# Patient Record
Sex: Female | Born: 1965 | Hispanic: Yes | Smoking: Never smoker
Health system: Southern US, Community
[De-identification: ages and names within clinical notes are randomized; demographics above are authoritative.]

## PROBLEM LIST (undated history)

## (undated) DIAGNOSIS — T7840XA Allergy, unspecified, initial encounter: Secondary | ICD-10-CM

## (undated) DIAGNOSIS — J45909 Unspecified asthma, uncomplicated: Secondary | ICD-10-CM

## (undated) DIAGNOSIS — F419 Anxiety disorder, unspecified: Secondary | ICD-10-CM

## (undated) HISTORY — DX: Unspecified asthma, uncomplicated: J45.909

## (undated) HISTORY — DX: Allergy, unspecified, initial encounter: T78.40XA

## (undated) HISTORY — DX: Anxiety disorder, unspecified: F41.9

---

## 2012-03-15 LAB — HM PAP SMEAR: HM Pap smear: NORMAL

## 2012-03-15 LAB — HM MAMMOGRAPHY: HM Mammogram: NORMAL

## 2015-04-29 ENCOUNTER — Telehealth: Payer: Self-pay | Admitting: Behavioral Health

## 2015-04-29 ENCOUNTER — Encounter: Payer: Self-pay | Admitting: Behavioral Health

## 2015-04-29 NOTE — Telephone Encounter (Signed)
Pre-Visit Call completed with patient and chart updated.   Pre-Visit Info documented in Specialty Comments under [employer].    

## 2015-04-30 ENCOUNTER — Encounter: Payer: Self-pay | Admitting: Medical

## 2015-04-30 ENCOUNTER — Ambulatory Visit (INDEPENDENT_AMBULATORY_CARE_PROVIDER_SITE_OTHER): Payer: BC Managed Care – PPO | Admitting: Medical

## 2015-04-30 VITALS — BP 106/78 | HR 78 | Temp 98.1°F | Ht 65.5 in | Wt 168.8 lb

## 2015-04-30 DIAGNOSIS — F4323 Adjustment disorder with mixed anxiety and depressed mood: Secondary | ICD-10-CM

## 2015-04-30 DIAGNOSIS — M2550 Pain in unspecified joint: Secondary | ICD-10-CM

## 2015-04-30 DIAGNOSIS — R635 Abnormal weight gain: Secondary | ICD-10-CM | POA: Diagnosis not present

## 2015-04-30 DIAGNOSIS — J309 Allergic rhinitis, unspecified: Secondary | ICD-10-CM

## 2015-04-30 DIAGNOSIS — R5383 Other fatigue: Secondary | ICD-10-CM

## 2015-04-30 LAB — COMPREHENSIVE METABOLIC PANEL
ALT: 15 U/L (ref 0–35)
AST: 18 U/L (ref 0–37)
Albumin: 4.1 g/dL (ref 3.5–5.2)
Alkaline Phosphatase: 51 U/L (ref 39–117)
BUN: 14 mg/dL (ref 6–23)
CALCIUM: 9.3 mg/dL (ref 8.4–10.5)
CHLORIDE: 104 meq/L (ref 96–112)
CO2: 31 meq/L (ref 19–32)
Creatinine, Ser: 0.75 mg/dL (ref 0.40–1.20)
GFR: 87.55 mL/min (ref >60.00)
Glucose, Bld: 94 mg/dL (ref 70–99)
POTASSIUM: 4 meq/L (ref 3.5–5.1)
Sodium: 139 mEq/L (ref 135–145)
Total Bilirubin: 0.5 mg/dL (ref 0.2–1.2)
Total Protein: 7.2 g/dL (ref 6.0–8.3)

## 2015-04-30 LAB — CBC WITH DIFFERENTIAL/PLATELET
BASOS PCT: 0.4 % (ref 0.0–3.0)
Basophils Absolute: 0 10*3/uL (ref 0.0–0.1)
EOS ABS: 0.2 10*3/uL (ref 0.0–0.7)
EOS PCT: 2.9 % (ref 0.0–5.0)
HEMATOCRIT: 42.5 % (ref 36.0–46.0)
Hemoglobin: 14.1 g/dL (ref 12.0–15.0)
LYMPHS PCT: 24.3 % (ref 12.0–46.0)
Lymphs Abs: 1.7 10*3/uL (ref 0.7–4.0)
MCHC: 33.3 g/dL (ref 30.0–36.0)
MCV: 93.7 fl (ref 78.0–100.0)
Monocytes Absolute: 0.4 10*3/uL (ref 0.1–1.0)
Monocytes Relative: 5.1 % (ref 3.0–12.0)
NEUTROS ABS: 4.7 10*3/uL (ref 1.4–7.7)
Neutrophils Relative %: 67.3 % (ref 43.0–77.0)
PLATELETS: 278 10*3/uL (ref 150.0–400.0)
RBC: 4.53 Mil/uL (ref 3.87–5.11)
RDW: 13 % (ref 11.5–15.5)
WBC: 7 10*3/uL (ref 4.0–10.5)

## 2015-04-30 LAB — VITAMIN B12: VITAMIN B 12: 321 pg/mL (ref 211–911)

## 2015-04-30 LAB — RHEUMATOID FACTOR

## 2015-04-30 LAB — TSH: TSH: 0.67 u[IU]/mL (ref 0.35–4.50)

## 2015-04-30 LAB — SEDIMENTATION RATE: SED RATE: 16 mm/h (ref 0–22)

## 2015-04-30 LAB — C-REACTIVE PROTEIN: CRP: 0.2 mg/dL — AB (ref 0.5–20.0)

## 2015-04-30 MED ORDER — AZELASTINE HCL 0.1 % NA SOLN: 2.0000 | Freq: Two times a day (BID) | NASAL | Status: DC

## 2015-04-30 NOTE — Patient Instructions (Addendum)
For you allergies. Continue zytec and will  add astelin spray.  For fatigue and weight gain will get cbc,cmp  And tsh.  For joint and muscle pains will get inflammatory studies.  Will do studies first to see if cause of depressed mood, anxious, fatigue, weight gain and body aches can be found. If our mood worsens or changes please notify us.  Follow up 2-3 weeks or as needed

## 2015-04-30 NOTE — Assessment & Plan Note (Signed)
Continue zyrtec. Will add astelin spray for her chronic year round allergies.

## 2015-04-30 NOTE — Progress Notes (Signed)
Subjective:    Patient ID: Brooke Mcmahon, female    DOB: 1966/01/14, 50 y.o.   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  Allergies- all year round. Pt takes Zyrtec. No sprays. Pt willing to try sprays but various meds in past did not help much. Zyrtec helps a little.  Asthma- pt state hx of. Pt states last time had to use inhaler was 4-5 months ago. She uses advair. But she does not use it daily.  Anxiety/depression- Pt states this is going on for 2 weeks.  Pt states she has been crying occasional since Sunday. Pt can't figure out what could be causing this. She states everything good in her life. Pt states she is falls asleep fine. Appetite ok. Pt has gained some weight about 20 lbs in past 6-7 months. LMP- last Friday. Was normal cycle. Pt still having enjoyment with life activities. She has friends. No work stress.  Pt states her son has some bipolar history. But other family with psychiatric diagnosis. Pt states no history of depression or anxiety for herself.   Pt states with this she has some pain in her feet along with this . Pt skin feels some sensitive in various areas. Pt denies any elow or knee pain. Pt states bottom of her feet hurt most. Pt states she walked 4-5 miles last weekend. Traasient pain on and off left rib, both hamsring areas and buttox. These area come and go. Also some anterior tibial area pain.  Last papsmear- was normal. Mammogram- normal in the past.    Review of Systems  Constitutional: Positive for fatigue. Negative for fever and chills.  HENT: Negative for dental problem.   Respiratory: Negative for cough, shortness of breath and wheezing.   Cardiovascular: Negative for chest pain and palpitations.  Musculoskeletal: Positive for myalgias  and arthralgias.       See hpi.   Skin: Negative for rash.  Hematological: Negative for adenopathy. Does not bruise/bleed easily.  Psychiatric/Behavioral: Positive for dysphoric mood. Negative for suicidal ideas, confusion, sleep disturbance, self-injury, decreased concentration and agitation. The patient is nervous/anxious.    Past Medical History  Diagnosis Date  . Allergy   . Asthma   . Anxiety     Social History   Social History  . Marital Status: Single    Spouse Name: N/A  . Number of Children: N/A  . Years of Education: N/A   Occupational History  . Not on file.   Social History Main Topics  . Smoking status: Never Smoker   . Smokeless tobacco: Never Used  . Alcohol Use: 0.0 oz/week    0 Standard drinks or equivalent per week     Comment: occasionally. 2 beers a month at most.  . Drug Use: No  . Sexual Activity: Not on file   Other Topics Concern  . Not on file   Social History Narrative    Past Surgical History  Procedure Laterality Date  . Cesarean section      Family History  Problem Relation Age of Onset  . Hypertension Mother   . Hypertension Father   . Heart disease Sister     No Known Allergies  Current Outpatient Prescriptions on File Prior to Visit  Medication Sig Dispense Refill  .  Multiple Vitamins-Minerals (MULTIVITAMIN WITH MINERALS) tablet Take 1 tablet by mouth daily.     No current facility-administered medications on file prior to visit.    BP 106/78 mmHg  Pulse 78  Temp(Src) 98.1 F (36.7 C) (Oral)  Ht 5' 5.5" (1.664 m)  Wt 168 lb 12.8 oz (76.567 kg)  BMI 27.65 kg/m2  SpO2 98%  LMP 04/25/2015       Objective:   Physical Exam  General- No acute distress. Pleasant patient. Heent- boggy turbinates, +pnd. Neck- Full range of motion, no jvd. (no thyromegaly) Lungs- Clear, even and unlabored. Heart- regular rate and rhythm. Neurologic- CNII- XII grossly intact. Back- no cva tenderness  Rt ankle- no pain on flexion  and extension.No pain on palpation.  Skin- no rash reported(discussed with pt and she assured me.) No vesicles seen.        Assessment & Plan:  For you allergies. Continue zytec and will  add astelin spray.  For fatigue and weight gain will get cbc,cmp  And tsh.  For joint and muscle pains will get inflammatory studies.  Will do studies first to see if cause of depressed mood, anxious, fatigue, weight gain and body aches can be found. If our mood worsens or changes please notify us.  Follow up 2-3 weeks or as needed

## 2015-04-30 NOTE — Progress Notes (Signed)
Pre visit review using our clinic review tool, if applicable. No additional management support is needed unless otherwise documented below in the visit note. 

## 2015-05-01 ENCOUNTER — Telehealth: Payer: Self-pay | Admitting: Medical

## 2015-05-01 DIAGNOSIS — R768 Other specified abnormal immunological findings in serum: Secondary | ICD-10-CM

## 2015-05-01 LAB — ANTI-NUCLEAR AB-TITER (ANA TITER)

## 2015-05-01 LAB — ANA: Anti Nuclear Antibody(ANA): POSITIVE — AB

## 2015-05-01 NOTE — Telephone Encounter (Signed)
Rheumatology referral placed

## 2015-05-21 ENCOUNTER — Telehealth: Payer: Self-pay | Admitting: Medical

## 2015-05-21 ENCOUNTER — Encounter: Payer: BC Managed Care – PPO | Admitting: Medical

## 2015-05-21 NOTE — Telephone Encounter (Signed)
Pt called in at 8:12 she says that she couldn't find her car keys so she couldn't come in. Offered pt a later time. She says that will call back to reschedule.

## 2015-05-21 NOTE — Progress Notes (Signed)
This encounter was created in error - please disregard.

## 2015-05-22 NOTE — Telephone Encounter (Signed)
No charge. 

## 2015-05-22 NOTE — Telephone Encounter (Signed)
Charge or no charge? °

## 2015-09-08 ENCOUNTER — Ambulatory Visit (INDEPENDENT_AMBULATORY_CARE_PROVIDER_SITE_OTHER): Payer: BC Managed Care – PPO | Admitting: Family Medicine

## 2015-09-08 ENCOUNTER — Ambulatory Visit (HOSPITAL_BASED_OUTPATIENT_CLINIC_OR_DEPARTMENT_OTHER)
Admission: RE | Admit: 2015-09-08 | Discharge: 2015-09-08 | Disposition: A | Discharge status: Home / Self Care | Payer: BC Managed Care – PPO | Source: Ambulatory Visit | Attending: Family Medicine | Admitting: Family Medicine

## 2015-09-08 ENCOUNTER — Encounter: Payer: Self-pay | Admitting: Family Medicine

## 2015-09-08 VITALS — BP 116/74 | HR 66 | Temp 98.2°F | Ht 64.0 in | Wt 168.8 lb

## 2015-09-08 DIAGNOSIS — S46911A Strain of unspecified muscle, fascia and tendon at shoulder and upper arm level, right arm, initial encounter: Secondary | ICD-10-CM

## 2015-09-08 DIAGNOSIS — S161XXA Strain of muscle, fascia and tendon at neck level, initial encounter: Secondary | ICD-10-CM

## 2015-09-08 DIAGNOSIS — X58XXXA Exposure to other specified factors, initial encounter: Secondary | ICD-10-CM | POA: Diagnosis not present

## 2015-09-08 MED ORDER — PREDNISONE 20 MG PO TABS: ORAL_TABLET | ORAL | Status: DC

## 2015-09-08 MED ORDER — METHOCARBAMOL 500 MG PO TABS: 500.0000 mg | ORAL_TABLET | Freq: Three times a day (TID) | ORAL | Status: DC | PRN

## 2015-09-08 NOTE — Progress Notes (Signed)
[facility] [place] [address], [address] [place], [place] [address] [phone] [phone]  Date:  09/08/2015   Name:  Brooke Mcmahon   DOB:  1965/05/06   MRN:  [id number]  PCP:  Esperanza RichtersSaguier, Edward, PA-C    Chief Complaint: Right Shoulder Pain   History of Present Illness:  Brooke Mcmahon is a 50 y.o. very pleasant female patient who presents with the following:  She has noted an issue with her right arm and neck -pain seems most concentrated in the deltoid muscle.  She is not aware of any injury, any pull on her neck that might cause a brachial plexus injury or other cause of the pain She has noted this for a couple of days.   There has been no accident or pull of the arm/ shoulder.  She does sleep with her arm over her head some of the time- first noted the sx in the morning so it may be related to her sleep position   She had a similar issue about 5 years ago but it self- resolved. Never had any evalaution for this    She is generally in good health LMP 6/5 She has tried some aleve for this.  She has not applied anythning topically or any rx issues  Never had any neck issues or shoulder issues in the past OW feels well today  Patient Active Problem List   Diagnosis Date Noted  . Allergic rhinitis 04/30/2015    Past Medical History  Diagnosis Date  . Allergy   . Asthma   . Anxiety     Past Surgical History  Procedure Laterality Date  . Cesarean section      Social History  Substance Use Topics  . Smoking status: Never Smoker   . Smokeless tobacco: Never Used  . Alcohol Use: 0.0 oz/week    0 Standard drinks or equivalent per week     Comment: occasionally. 2 beers a month at most.    Family History  Problem Relation Age of Onset  . Hypertension Mother   . Hypertension Father   . Heart disease Sister     No Known Allergies  Medication list has been reviewed and updated.  Current Outpatient Prescriptions on File  Prior to Visit  Medication Sig Dispense Refill  . Multiple Vitamins-Minerals (MULTIVITAMIN WITH MINERALS) tablet Take 1 tablet by mouth daily.     No current facility-administered medications on file prior to visit.    Review of Systems:  As per HPI- otherwise negative.   Physical Examination: Filed Vitals:   09/08/15 1142  BP: 116/74  Pulse: 66  Temp: 98.2 F (36.8 C)   Filed Vitals:   09/08/15 1142  Height: 5\' 4"  (1.626 m)  Weight: 168 lb 12.8 oz (76.567 kg)   Body mass index is 28.96 kg/(m^2). Ideal Body Weight: Weight in (lb) to have BMI = 25: 145.3  GEN: WDWN, NAD, Non-toxic, A & O x 3, mild overweight, looks well and healthy HEENT: Atraumatic, Normocephalic. Neck supple. No masses, No LAD.  Bilateral TM wnl, oropharynx normal.  PEERL,EOMI.   Ears and Nose: No external deformity. CV: RRR, No M/G/R. No JVD. No thrill. No extra heart sounds. PULM: CTA B, no wheezes, crackles, rhonchi. No retractions. No resp. distress. No accessory muscle use. ABD: S, NT, ND, +BS. No rebound. No HSM. EXTR: No c/c/e NEURO Normal gait.  PSYCH: Normally interactive. Conversant. Not depressed or anxious appearing.  Calm demeanor.  She notes tenderness over the lateral deltoid muscle, no heat, redness or swelling. She has normal ROM of the right shoulder and normal strength.  Not tender over RCT insertion.   Cervical spine is non- tender and shows full ROM  No numbness of either arm on exam  Dg Cervical Spine Complete  09/08/2015  CLINICAL DATA:  Neck strain EXAM: CERVICAL SPINE - COMPLETE 4+ VIEW COMPARISON:  None. FINDINGS: There is no evidence of cervical spine fracture or prevertebral soft tissue swelling. Alignment is normal. No other significant bone abnormalities are identified. IMPRESSION: Negative cervical spine radiographs. Electronically Signed   By: Marlan Palauharles  Clark M.D.   On: 09/08/2015 13:12    Assessment and Plan: Right shoulder strain, initial encounter - Plan: methocarbamol  (ROBAXIN) 500 MG tablet, predniSONE (DELTASONE) 20 MG tablet  Neck strain, initial encounter - Plan: DG Cervical Spine Complete  Jere today with pain in her neck and right shoulder  Her sx seem consistent with a brachial plexus strain but there is no injury Will treat with prednisone and a muscle relaxer as needed Neck films normal She will let me know if not improving soon  Signed Abbe AmsterdamJessica Copland, MD

## 2015-09-08 NOTE — Progress Notes (Signed)
Pre visit review using our clinic review tool, if applicable. No additional management support is needed unless otherwise documented below in the visit note. 

## 2015-09-08 NOTE — Patient Instructions (Signed)
Please go to the ground floor for x-rays, then you may go home I will be in touch with your x-ray results asap  Use the robaxin as needed for muscle pain- however remember that it may cause you to feel sleepy Use the prednisone as directed for inflammation.  While you are taking this do not take NSAID medications like ibuprofen or aleve (tyelnol is ok)  Let me know if your neck and shoulder are not feeling much better in the next couple of days- Sooner if worse.

## 2015-09-17 ENCOUNTER — Telehealth: Payer: Self-pay | Admitting: Medical

## 2015-09-17 DIAGNOSIS — M542 Cervicalgia: Secondary | ICD-10-CM

## 2015-09-17 DIAGNOSIS — R200 Anesthesia of skin: Secondary | ICD-10-CM

## 2015-09-17 DIAGNOSIS — R202 Paresthesia of skin: Principal | ICD-10-CM

## 2015-09-17 NOTE — Telephone Encounter (Signed)
Relation to ZO:XWRUpt:self Call back number:[phone] Pharmacy:  Reason for call:  Patient was last seen 09/08/2015 states the medication prescribe is not working, in need of clinical advice. Advised patient Dr. Patsy Lageropland is out of the office. Please advise

## 2015-09-18 NOTE — Telephone Encounter (Signed)
Called and spoke to pt who was seen on 09/08/15 for right arm and shoulder pain. Pt states that the pain is still present and does not feel the rx for Robaxin and Prednisone are helping.

## 2015-09-18 NOTE — Telephone Encounter (Signed)
Called her back - her sx are not changed, she continues to have numbness and tingling in her right arm and her neck is painful. At this time I will arrange an MRI for her. She is ok with this plan and confirms that she does not have any implanted devices

## 2015-09-19 ENCOUNTER — Telehealth: Payer: Self-pay | Admitting: Medical

## 2015-09-19 DIAGNOSIS — M542 Cervicalgia: Secondary | ICD-10-CM

## 2015-09-19 NOTE — Telephone Encounter (Signed)
Would you check and see if I ordered mri or if Dr. Patsy Lageropland did? If patient states too costly we could refer to sports medicine.

## 2015-09-19 NOTE — Telephone Encounter (Signed)
Please advise 

## 2015-09-19 NOTE — Telephone Encounter (Signed)
Patient called stating that her insurance will not cover the cost of an MRI and she will have to pay $800. Wants to know if there is another test she can have done for her symptoms. Patient cancelled MRI

## 2015-09-20 ENCOUNTER — Ambulatory Visit (HOSPITAL_BASED_OUTPATIENT_CLINIC_OR_DEPARTMENT_OTHER): Payer: BC Managed Care – PPO

## 2015-09-22 NOTE — Telephone Encounter (Addendum)
Spoke with pt and she states that she is ok to go ahead with the sports medicine referral. Pt was advised of the appointment on 09/25/15 for sports medicine at 9:30 am. Pt did not have any further questions. .Marland Kitchen

## 2015-09-25 ENCOUNTER — Ambulatory Visit (INDEPENDENT_AMBULATORY_CARE_PROVIDER_SITE_OTHER): Payer: BC Managed Care – PPO | Admitting: Family Medicine

## 2015-09-25 ENCOUNTER — Encounter: Payer: Self-pay | Admitting: Family Medicine

## 2015-09-25 VITALS — BP 111/76 | HR 69 | Ht 64.0 in | Wt 166.6 lb

## 2015-09-25 DIAGNOSIS — M79601 Pain in right arm: Secondary | ICD-10-CM | POA: Diagnosis not present

## 2015-09-25 DIAGNOSIS — M25511 Pain in right shoulder: Secondary | ICD-10-CM | POA: Diagnosis not present

## 2015-09-25 MED ORDER — METHYLPREDNISOLONE ACETATE 40 MG/ML IJ SUSP
40.0000 mg | Freq: Once | INTRAMUSCULAR | Status: AC
Start: 2015-09-25 — End: 2015-09-25
  Administered 2015-09-25: 40 mg via INTRA_ARTICULAR

## 2015-09-25 MED ORDER — HYDROCODONE-ACETAMINOPHEN 5-325 MG PO TABS: 1.0000 | ORAL_TABLET | Freq: Four times a day (QID) | ORAL | Status: DC | PRN

## 2015-09-25 NOTE — Patient Instructions (Addendum)
You have rotator cuff impingement in addition to an irritated nerve from your neck. Try to avoid painful activities (overhead activities, lifting with extended arm) as much as possible. Aleve 2 tabs twice a day with food OR ibuprofen 3 tabs three times a day with food for pain and inflammation. Norco as needed for severe pain (no driving on this). Subacromial injection may be beneficial to help with pain and to decrease inflammation - you were given this today. Consider physical therapy with transition to home exercise program. Do home exercise program with theraband and scapular stabilization exercises daily - these are very important for long term relief even if an injection was given.  Wait a week before starting these and do 3 sets of 10 if tolerated If not improving at follow-up we will consider further imaging, physical therapy, and/or nitro patches. Call me in 1-2 weeks to let me know how you're doing.

## 2015-09-29 DIAGNOSIS — M79601 Pain in right arm: Secondary | ICD-10-CM | POA: Insufficient documentation

## 2015-09-29 NOTE — Progress Notes (Signed)
PCP and consultation requested by: Esperanza RichtersSaguier, Edward, PA-C  Subjective:   HPI: Patient is a 50 y.o. female here for right arm pain.  Patient reports for about 1 month she's had fairly consistent pain right upper arm into lower arm and neck.   Burning sensation from right side of neck to the elbow. Gets cold sensation from elbow to fingers, right ring finger goes numb. Right handed. Had similar problem 5-6 years ago that improved with nsaids and muscle relaxant. Current pain 10/10, sharp, worse with motions of shoulder. No new injury or trauma.  Past Medical History  Diagnosis Date  . Allergy   . Asthma   . Anxiety     Current Outpatient Prescriptions on File Prior to Visit  Medication Sig Dispense Refill  . albuterol (PROVENTIL HFA;VENTOLIN HFA) 108 (90 Base) MCG/ACT inhaler Inhale 2 puffs into the lungs as needed.    . Fluticasone-Salmeterol (ADVAIR DISKUS) 250-50 MCG/DOSE AEPB Inhale 1 puff into the lungs as needed.    . methocarbamol (ROBAXIN) 500 MG tablet Take 1 tablet (500 mg total) by mouth every 8 (eight) hours as needed for muscle spasms. 30 tablet 0  . Multiple Vitamins-Minerals (MULTIVITAMIN WITH MINERALS) tablet Take 1 tablet by mouth daily.     No current facility-administered medications on file prior to visit.    Past Surgical History  Procedure Laterality Date  . Cesarean section      No Known Allergies  Social History   Social History  . Marital Status: Single    Spouse Name: N/A  . Number of Children: N/A  . Years of Education: N/A   Occupational History  . Not on file.   Social History Main Topics  . Smoking status: Never Smoker   . Smokeless tobacco: Never Used  . Alcohol Use: 0.0 oz/week    0 Standard drinks or equivalent per week     Comment: occasionally. 2 beers a month at most.  . Drug Use: No  . Sexual Activity: Not on file   Other Topics Concern  . Not on file   Social History Narrative    Family History  Problem Relation Age  of Onset  . Hypertension Mother   . Hypertension Father   . Heart disease Sister     BP 111/76 mmHg  Pulse 69  Ht 5\' 4"  (1.626 m)  Wt 166 lb 9.6 oz (75.569 kg)  BMI 28.58 kg/m2  LMP 08/18/2015  Review of Systems: See HPI above.    Objective:  Physical Exam:  Gen: NAD, comfortable in exam room  Neck: No gross deformity, swelling, bruising. TTP mildly right cervical paraspinal region.  No midline/bony TTP. FROM neck without pain. BUE strength 5/5 except rotator cuff testing below.   Sensation intact to light touch.   2+ equal reflexes in triceps, biceps, brachioradialis tendons. Negative spurlings. NV intact distal BUEs.  Right shoulder: No swelling, ecchymoses.  No gross deformity. No TTP. FROM with painful arc. Positive Hawkins, Neers. Negative Yergasons. Strength 5/5 with empty can and resisted internal/external rotation.  Painful empty can > ER Negative apprehension. NV intact distally.    Assessment & Plan:  1. Right arm pain - patient's history suggests radiculopathy but exam more of rotator cuff impingement.  Given subacromial injection.  NSAIDs as needed, norco as needed severe pain.  Shown home exercises to do daily.  Consider further imaging, physical therapy, and/or nitro patches.  Call us in 1-2 weeks for an update on her status.

## 2015-09-29 NOTE — Assessment & Plan Note (Signed)
patient's history suggests radiculopathy but exam more of rotator cuff impingement.  Given subacromial injection.  NSAIDs as needed, norco as needed severe pain.  Shown home exercises to do daily.  Consider further imaging, physical therapy, and/or nitro patches.  Call us in 1-2 weeks for an update on her status.

## 2015-11-11 ENCOUNTER — Encounter: Payer: Self-pay | Admitting: Family Medicine

## 2015-11-11 ENCOUNTER — Ambulatory Visit (INDEPENDENT_AMBULATORY_CARE_PROVIDER_SITE_OTHER): Payer: BC Managed Care – PPO | Admitting: Family Medicine

## 2015-11-11 DIAGNOSIS — M79601 Pain in right arm: Secondary | ICD-10-CM

## 2015-11-11 MED ORDER — DICLOFENAC SODIUM 75 MG PO TBEC: 75.0000 mg | DELAYED_RELEASE_TABLET | Freq: Two times a day (BID) | ORAL | 1 refills | Status: DC

## 2015-11-11 MED ORDER — NITROGLYCERIN 0.2 MG/HR TD PT24: MEDICATED_PATCH | TRANSDERMAL | 1 refills | Status: DC

## 2015-11-11 NOTE — Patient Instructions (Addendum)
You have rotator cuff impingement and bursitis. Try to avoid painful activities (overhead activities, lifting with extended arm) as much as possible. Diclofenac 75mg  twice a day with food for pain and inflammation. I would not repeat the injection at this time. Nitro patches 1/4th patch over affected area, change daily. Consider physical therapy with transition to home exercise program. Do home exercise program with theraband and scapular stabilization exercises daily - these are very important for long term relief even if an injection was given.  3 sets of 10 once a day. Follow up with me in 6 weeks. However if you're not improving over the next 2-4 weeks, call me and we would order physical therapy.

## 2015-11-13 NOTE — Progress Notes (Signed)
PCP and consultation requested by: Brooke Mcmahon, Edward, PA-C  Subjective:   HPI: Patient is a 50 y.o. female here for right arm pain.  7/13: Patient reports for about 1 month she's had fairly consistent pain right upper arm into lower arm and neck.   Burning sensation from right side of neck to the elbow. Gets cold sensation from elbow to fingers, right ring finger goes numb. Right handed. Had similar problem 5-6 years ago that improved with nsaids and muscle relaxant. Current pain 10/10, sharp, worse with motions of shoulder. No new injury or trauma.  8/30: Patient returns reporting she feels about the same. Pain is 8/10, sharp lateral right shoulder, upper arm. Reports injection only helped about 1 day. Having difficulty sleeping. Is doing home exercises. Worse lying on this die too. No numbness or tingling now. No skin changes.  Past Medical History:  Diagnosis Date  . Allergy   . Anxiety   . Asthma     Current Outpatient Prescriptions on File Prior to Visit  Medication Sig Dispense Refill  . albuterol (PROVENTIL HFA;VENTOLIN HFA) 108 (90 Base) MCG/ACT inhaler Inhale 2 puffs into the lungs as needed.    . Fluticasone-Salmeterol (ADVAIR DISKUS) 250-50 MCG/DOSE AEPB Inhale 1 puff into the lungs as needed.    . methocarbamol (ROBAXIN) 500 MG tablet Take 1 tablet (500 mg total) by mouth every 8 (eight) hours as needed for muscle spasms. 30 tablet 0  . Multiple Vitamins-Minerals (MULTIVITAMIN WITH MINERALS) tablet Take 1 tablet by mouth daily.     No current facility-administered medications on file prior to visit.     Past Surgical History:  Procedure Laterality Date  . CESAREAN SECTION      No Known Allergies  Social History   Social History  . Marital status: Single    Spouse name: N/A  . Number of children: N/A  . Years of education: N/A   Occupational History  . Not on file.   Social History Main Topics  . Smoking status: Never Smoker  . Smokeless  tobacco: Never Used  . Alcohol use 0.0 oz/week     Comment: occasionally. 2 beers a month at most.  . Drug use: No  . Sexual activity: Not on file   Other Topics Concern  . Not on file   Social History Narrative  . No narrative on file    Family History  Problem Relation Age of Onset  . Hypertension Mother   . Hypertension Father   . Heart disease Sister     BP (!) 103/59   Pulse 73   Ht 5\' 4"  (1.626 m)   Wt 166 lb (75.3 kg)   BMI 28.49 kg/m   Review of Systems: See HPI above.    Objective:  Physical Exam:  Gen: NAD, comfortable in exam room  Neck: No gross deformity, swelling, bruising. No TTP paraspinal regions.  No midline/bony TTP. FROM neck without pain. BUE strength 5/5 except rotator cuff testing below.   Sensation intact to light touch.   2+ equal reflexes in triceps, biceps, brachioradialis tendons. Negative spurlings. NV intact distal BUEs.  Right shoulder: No swelling, ecchymoses.  No gross deformity. No TTP. FROM with painful arc. Positive Hawkins, Neers. Negative Yergasons. Strength 5/5 with empty can and resisted internal/external rotation.  Painful empty can > ER Negative apprehension. NV intact distally.  MSK u/s right shoulder: Biceps tendon intact on long and trans views.  Subscapularis, infraspinatus, supraspinatus also intact on long and trans views.  Subacromial  bursitis noted.  AC joint normal.  Could not reproduce impingement on ultrasound.    Assessment & Plan:  1. Right shoulder pain - 2/2 subacromial bursitis, exam indicates impingement also but unable to reproduce this part on ultrasound.  No improvement with injection.  She will start diclofenac, nitro patches, continue home exercises.  Will consider physical therapy if not improving over next couple weeks.  F/u in 6 weeks.

## 2015-11-13 NOTE — Assessment & Plan Note (Signed)
2/2 subacromial bursitis, exam indicates impingement also but unable to reproduce this part on ultrasound.  No improvement with injection.  She will start diclofenac, nitro patches, continue home exercises.  Will consider physical therapy if not improving over next couple weeks.  F/u in 6 weeks.

## 2015-12-10 ENCOUNTER — Ambulatory Visit: Payer: BC Managed Care – PPO | Admitting: Family Medicine

## 2015-12-16 ENCOUNTER — Encounter: Payer: Self-pay | Admitting: Family Medicine

## 2015-12-16 ENCOUNTER — Ambulatory Visit (INDEPENDENT_AMBULATORY_CARE_PROVIDER_SITE_OTHER): Payer: BC Managed Care – PPO | Admitting: Family Medicine

## 2015-12-16 DIAGNOSIS — M79601 Pain in right arm: Secondary | ICD-10-CM

## 2015-12-16 NOTE — Patient Instructions (Signed)
Your shoulder exam is normal now. You have a forearm strain. I would continue home exercises for 4-6 more weeks. Consider adding wrist extension, hammer rotation exercises for the forearm. Call me if you want to try physical therapy. Use diclofenac only if needed. Nitro patches 1/4th patch over affected area, change daily. Follow up with me as needed.

## 2015-12-17 NOTE — Progress Notes (Signed)
PCP and consultation requested by: Esperanza RichtersSaguier, Edward, PA-C  Subjective:   HPI: Patient is a 50 y.o. female here for right arm pain.  7/13: Patient reports for about 1 month she's had fairly consistent pain right upper arm into lower arm and neck.   Burning sensation from right side of neck to the elbow. Gets cold sensation from elbow to fingers, right ring finger goes numb. Right handed. Had similar problem 5-6 years ago that improved with nsaids and muscle relaxant. Current pain 10/10, sharp, worse with motions of shoulder. No new injury or trauma.  8/30: Patient returns reporting she feels about the same. Pain is 8/10, sharp lateral right shoulder, upper arm. Reports injection only helped about 1 day. Having difficulty sleeping. Is doing home exercises. Worse lying on this die too. No numbness or tingling now. No skin changes.  10/3: Patient reports she has improved. Pain level is 0/10 currently. Difficulty picking up heavy items - can get 7/10 level of pain in upper arm area. Stopped diclofenac and nitro last week. Still doing home exercises.  Past Medical History:  Diagnosis Date  . Allergy   . Anxiety   . Asthma     Current Outpatient Prescriptions on File Prior to Visit  Medication Sig Dispense Refill  . albuterol (PROVENTIL HFA;VENTOLIN HFA) 108 (90 Base) MCG/ACT inhaler Inhale 2 puffs into the lungs as needed.    . diclofenac (VOLTAREN) 75 MG EC tablet Take 1 tablet (75 mg total) by mouth 2 (two) times daily. 60 tablet 1  . Fluticasone-Salmeterol (ADVAIR DISKUS) 250-50 MCG/DOSE AEPB Inhale 1 puff into the lungs as needed.    . methocarbamol (ROBAXIN) 500 MG tablet Take 1 tablet (500 mg total) by mouth every 8 (eight) hours as needed for muscle spasms. 30 tablet 0  . Multiple Vitamins-Minerals (MULTIVITAMIN WITH MINERALS) tablet Take 1 tablet by mouth daily.    . nitroGLYCERIN (NITRODUR - DOSED IN MG/24 HR) 0.2 mg/hr patch Apply 1/4th patch to affected shoulder,  change daily 30 patch 1   No current facility-administered medications on file prior to visit.     Past Surgical History:  Procedure Laterality Date  . CESAREAN SECTION      No Known Allergies  Social History   Social History  . Marital status: Single    Spouse name: N/A  . Number of children: N/A  . Years of education: N/A   Occupational History  . Not on file.   Social History Main Topics  . Smoking status: Never Smoker  . Smokeless tobacco: Never Used  . Alcohol use 0.0 oz/week     Comment: occasionally. 2 beers a month at most.  . Drug use: No  . Sexual activity: Not on file   Other Topics Concern  . Not on file   Social History Narrative  . No narrative on file    Family History  Problem Relation Age of Onset  . Hypertension Mother   . Hypertension Father   . Heart disease Sister     BP 114/77   Pulse 80   Ht 5\' 4"  (1.626 m)   Wt 163 lb (73.9 kg)   BMI 27.98 kg/m   Review of Systems: See HPI above.    Objective:  Physical Exam:  Gen: NAD, comfortable in exam room  Neck: No gross deformity, swelling, bruising. No TTP paraspinal regions.  No midline/bony TTP. FROM neck without pain. BUE strength 5/5.   NV intact distal BUEs.  Right shoulder: No swelling, ecchymoses.  No gross deformity. No TTP shoulder - mild extensor forearm tenderness FROM with negative painful arc. Negative Hawkins, Neers. Negative Yergasons. Strength 5/5 with empty can and resisted internal/external rotation without pain Negative apprehension. NV intact distally.    Assessment & Plan:  1. Right shoulder pain - 2/2 subacromial bursitis, impingement.  Much improved with home exercises, having done diclofenac and nitro patches until a week ago.  Continue home exercises.  Diclofenac only if needed.  F/u prn.  Can continue nitro patches up to 6 weeks if needed.

## 2015-12-18 NOTE — Assessment & Plan Note (Signed)
2/2 subacromial bursitis, impingement.  Much improved with home exercises, having done diclofenac and nitro patches until a week ago.  Continue home exercises.  Diclofenac only if needed.  F/u prn.  Can continue nitro patches up to 6 weeks if needed.

## 2016-01-26 ENCOUNTER — Ambulatory Visit (INDEPENDENT_AMBULATORY_CARE_PROVIDER_SITE_OTHER): Payer: BC Managed Care – PPO | Admitting: Medical

## 2016-01-26 ENCOUNTER — Encounter: Payer: Self-pay | Admitting: Medical

## 2016-01-26 VITALS — BP 100/68 | HR 86 | Temp 98.5°F | Ht 64.0 in | Wt 161.2 lb

## 2016-01-26 DIAGNOSIS — R059 Cough, unspecified: Secondary | ICD-10-CM

## 2016-01-26 DIAGNOSIS — R05 Cough: Secondary | ICD-10-CM

## 2016-01-26 DIAGNOSIS — J209 Acute bronchitis, unspecified: Secondary | ICD-10-CM

## 2016-01-26 MED ORDER — BENZONATATE 100 MG PO CAPS: 100.0000 mg | ORAL_CAPSULE | Freq: Three times a day (TID) | ORAL | 0 refills | Status: DC | PRN

## 2016-01-26 MED ORDER — AZITHROMYCIN 250 MG PO TABS: ORAL_TABLET | ORAL | 0 refills | Status: DC

## 2016-01-26 NOTE — Progress Notes (Signed)
Pre visit review using our clinic review tool, if applicable. No additional management support is needed unless otherwise documented below in the visit note. 

## 2016-01-26 NOTE — Progress Notes (Signed)
Subjective:    Patient ID: Brooke Mcmahon, female    DOB: 1965/03/20, 50 y.o.   MRN: [id number]  HPI  Pt sick since past Thursday.   States feels more chest congestion with runny nose.  When she cough brings up mucous. Pt not wheezing. No fevers chills or sweats.  Cough is intermittent.  Pt has advair and albuterol. Pt has not been using.  Pt states she gets bronchitis easily.  LMP- not late. About 3 weeks ago.   Review of Systems  Constitutional: Negative for chills, fatigue and fever.  HENT: Negative for congestion, ear pain, nosebleeds, sinus pain, sinus pressure, sneezing and sore throat.   Respiratory: Positive for cough. Negative for chest tightness, shortness of breath and wheezing.        Productive cough with chest congestion.  Cardiovascular: Negative for chest pain.  Gastrointestinal: Negative for abdominal pain.  Musculoskeletal: Negative for back pain.  Neurological: Negative for headaches.  Hematological: Negative for adenopathy. Does not bruise/bleed easily.  Psychiatric/Behavioral: Negative for behavioral problems, decreased concentration and dysphoric mood. The patient is not nervous/anxious.    Past Medical History:  Diagnosis Date  . Allergy   . Anxiety   . Asthma      Social History   Social History  . Marital status: Single    Spouse name: N/A  . Number of children: N/A  . Years of education: N/A   Occupational History  . Not on file.   Social History Main Topics  . Smoking status: Never Smoker  . Smokeless tobacco: Never Used  . Alcohol use 0.0 oz/week     Comment: occasionally. 2 beers a month at most.  . Drug use: No  . Sexual activity: Not on file   Other Topics Concern  . Not on file   Social History Narrative  . No narrative on file    Past Surgical History:  Procedure Laterality Date  . CESAREAN SECTION      Family History  Problem Relation Age of Onset  . Hypertension Mother   . Hypertension Father   . Heart  disease Sister     No Known Allergies  Current Outpatient Prescriptions on File Prior to Visit  Medication Sig Dispense Refill  . albuterol (PROVENTIL HFA;VENTOLIN HFA) 108 (90 Base) MCG/ACT inhaler Inhale 2 puffs into the lungs as needed.    . diclofenac (VOLTAREN) 75 MG EC tablet Take 1 tablet (75 mg total) by mouth 2 (two) times daily. 60 tablet 1  . Fluticasone-Salmeterol (ADVAIR DISKUS) 250-50 MCG/DOSE AEPB Inhale 1 puff into the lungs as needed.    . methocarbamol (ROBAXIN) 500 MG tablet Take 1 tablet (500 mg total) by mouth every 8 (eight) hours as needed for muscle spasms. 30 tablet 0  . Multiple Vitamins-Minerals (MULTIVITAMIN WITH MINERALS) tablet Take 1 tablet by mouth daily.    . nitroGLYCERIN (NITRODUR - DOSED IN MG/24 HR) 0.2 mg/hr patch Apply 1/4th patch to affected shoulder, change daily 30 patch 1   No current facility-administered medications on file prior to visit.     BP 100/68 (BP Location: Left Arm, Patient Position: Sitting, Cuff Size: Normal)   Pulse 86   Temp 98.5 F (36.9 C) (Oral)   Ht 5\' 4"  (1.626 m)   Wt 161 lb 3.2 oz (73.1 kg)   SpO2 99%   BMI 27.67 kg/m      Objective:   Physical Exam  General  Mental Status - Alert. General Appearance - Well groomed. Not in  acute distress. Sounds nasal congested. Cough sounds mild wet.  Skin Rashes- No Rashes.  HEENT Head- Normal. Ear Auditory Canal - Left- Normal. Right - Normal.Tympanic Membrane- Left- Normal. Right- Normal. Eye Sclera/Conjunctiva- Left- Normal. Right- Normal. Nose & Sinuses Nasal Mucosa- Left-  Boggy and Congested. Right-  Boggy and  Congested.Bilateral no  maxillary and  No frontal sinus pressure. Mouth & Throat Lips: Upper Lip- Normal: no dryness, cracking, pallor, cyanosis, or vesicular eruption. Lower Lip-Normal: no dryness, cracking, pallor, cyanosis or vesicular eruption. Buccal Mucosa- Bilateral- No Aphthous ulcers. Oropharynx- No Discharge or Erythema. Tonsils:  Characteristics- Bilateral- No Erythema or Congestion. Size/Enlargement- Bilateral- No enlargement. Discharge- bilateral-None.  Neck Neck- Supple. No Masses.   Chest and Lung Exam Auscultation: Breath Sounds:-Clear even and unlabored.  Cardiovascular Auscultation:Rythm- Regular, rate and rhythm. Murmurs & Other Heart Sounds:Ausculatation of the heart reveal- No Murmurs.  Lymphatic Head & Neck General Head & Neck Lymphatics: Bilateral: Description- No Localized lymphadenopathy.       Assessment & Plan:  You appear to have bronchitis. Rest hydrate and tylenol for fever. I am prescribing cough medicine benzonatate, and azithromycin antibiotic.   You should gradually get better. If not then notify us and would recommend a chest xray.  If wheezing reoccurs then restart you inhalers.  Follow up in 7-10 days or as needed

## 2016-01-26 NOTE — Patient Instructions (Signed)
You appear to have bronchitis. Rest hydrate and tylenol for fever. I am prescribing cough medicine benzonatate, and azithromycin antibiotic.   You should gradually get better. If not then notify us and would recommend a chest xray.  If wheezing reoccurs then restart you inhalers.  Follow up in 7-10 days or as needed

## 2016-03-03 ENCOUNTER — Telehealth: Payer: Self-pay | Admitting: Medical

## 2016-03-03 DIAGNOSIS — Z1239 Encounter for other screening for malignant neoplasm of breast: Secondary | ICD-10-CM

## 2016-03-03 NOTE — Telephone Encounter (Signed)
Will you let me know about [facility] mammogram order process by Friday 22nd.

## 2016-03-03 NOTE — Telephone Encounter (Signed)
°  Relation to WG:NFAOpt:self Call back [phone]   Reason for call:  Patient requesting mamo orders please send to [facility] [address], [place], [place] [id number] [phone]

## 2016-03-03 NOTE — Telephone Encounter (Signed)
Pt wants us to fax over order for mammogram to [facility]. Since they are outside company I can't order through [facility]. Do we have mammogram order forms or can they fax over order so I can sign and then fax it back to them. Will you help me on this as I am unaware if we have such form.

## 2016-03-04 NOTE — Telephone Encounter (Signed)
Will send order over to [facility] and their office will call the patient

## 2016-03-04 NOTE — Telephone Encounter (Signed)
You will still need to place order for mammogram through [facility], just make sure you note [facility] on order

## 2016-03-04 NOTE — Telephone Encounter (Signed)
I put in external on choice of location and specified [facility]. But who coordinates this. Can they see order. Will you call them now?

## 2016-11-17 ENCOUNTER — Ambulatory Visit (INDEPENDENT_AMBULATORY_CARE_PROVIDER_SITE_OTHER): Payer: BC Managed Care – PPO | Admitting: Family Medicine

## 2016-11-17 ENCOUNTER — Encounter: Payer: Self-pay | Admitting: Family Medicine

## 2016-11-17 DIAGNOSIS — M25561 Pain in right knee: Secondary | ICD-10-CM | POA: Diagnosis not present

## 2016-11-17 DIAGNOSIS — M25562 Pain in left knee: Secondary | ICD-10-CM | POA: Diagnosis not present

## 2016-11-17 MED ORDER — MELOXICAM 15 MG PO TABS: 15.0000 mg | ORAL_TABLET | Freq: Every day | ORAL | 2 refills | Status: DC

## 2016-11-17 NOTE — Patient Instructions (Signed)
Your pain is due to medial meniscus tear with parameniscal cyst. These are the different medications you can take for this: Tylenol 500mg  1-2 tabs three times a day for pain. Capsaicin, aspercreme, or biofreeze topically up to four times a day may also help with pain. Meloxicam 15mg  with food at dinner to help with the nighttime pain and inflammation. Some supplements that may help: Boswellia extract, curcumin, pycnogenol Cortisone injections are an option with or without draining the cyst. It's important that you continue to stay active. Straight leg raises, knee extensions, hamstring curls and swings 3 sets of 10 once a day (add ankle weight if these become too easy). Start physical therapy to strengthen muscles around the joint that hurts to take pressure off of the joint itself. Shoe inserts with good arch support may be helpful. Ice 15 minutes at a time 3-4 times a day as needed to help with pain. Compression sleeve to help with the swelling and pain also Follow up with me in 6 weeks.

## 2016-11-21 DIAGNOSIS — M25561 Pain in right knee: Secondary | ICD-10-CM | POA: Insufficient documentation

## 2016-11-21 DIAGNOSIS — M25562 Pain in left knee: Secondary | ICD-10-CM

## 2016-11-21 NOTE — Assessment & Plan Note (Signed)
less symptomatic left knee likely due to mild degenerative changes.  Right knee confirmed parameniscal cyst with independently performed/reviewed ultrasound suggestive of medial meniscus tear.  Treat conservatively with physical therapy, tylenol, rx meloxicam.  Discussed topical medications, supplements.  Icing, compression.  F/u in 6 weeks.  Consider injection.

## 2016-11-21 NOTE — Progress Notes (Signed)
PCP: Esperanza RichtersSaguier, Edward, PA-C  Subjective:   HPI: Patient is a 51 y.o. female here for bilateral knee pain.  Patient states she's had several weeks of anterior bilateral knee pain. Pain worse after working out, doing yoga. Worse on right knee than left. Associated catching of right knee. Some swelling. Pain currently 0/10 but up to 5/10 on right, 2/10 on left, sharp Left knee is more anterior, right medial. No skin changes, numbness.  Past Medical History:  Diagnosis Date  . Allergy   . Anxiety   . Asthma     Current Outpatient Prescriptions on File Prior to Visit  Medication Sig Dispense Refill  . albuterol (PROVENTIL HFA;VENTOLIN HFA) 108 (90 Base) MCG/ACT inhaler Inhale 2 puffs into the lungs as needed.    . Fluticasone-Salmeterol (ADVAIR DISKUS) 250-50 MCG/DOSE AEPB Inhale 1 puff into the lungs as needed.    . Multiple Vitamins-Minerals (MULTIVITAMIN WITH MINERALS) tablet Take 1 tablet by mouth daily.    . nitroGLYCERIN (NITRODUR - DOSED IN MG/24 HR) 0.2 mg/hr patch Apply 1/4th patch to affected shoulder, change daily 30 patch 1   No current facility-administered medications on file prior to visit.     Past Surgical History:  Procedure Laterality Date  . CESAREAN SECTION      No Known Allergies  Social History   Social History  . Marital status: Single    Spouse name: N/A  . Number of children: N/A  . Years of education: N/A   Occupational History  . Not on file.   Social History Main Topics  . Smoking status: Never Smoker  . Smokeless tobacco: Never Used  . Alcohol use 0.0 oz/week     Comment: occasionally. 2 beers a month at most.  . Drug use: No  . Sexual activity: Not on file   Other Topics Concern  . Not on file   Social History Narrative  . No narrative on file    Family History  Problem Relation Age of Onset  . Hypertension Mother   . Hypertension Father   . Heart disease Sister     BP 94/67   Pulse 75   Ht 5\' 4"  (1.626 m)   Wt 153  lb (69.4 kg)   BMI 26.26 kg/m   Review of Systems: See HPI above.     Objective:  Physical Exam:  Gen: NAD, comfortable in exam room  Right knee: Mild effusion, localized swelling also over posterior aspect medial joint line.  No other gross deformity, ecchymoses. Mild TTP medial joint line. FROM. Negative ant/post drawers. Negative valgus/varus testing. Negative lachmanns. Negative mcmurrays, apleys, patellar apprehension. NV intact distally.  Left knee: No gross deformity, ecchymoses, swelling. No TTP. FROM. Negative ant/post drawers. Negative valgus/varus testing. Negative lachmanns. Negative mcmurrays, apleys, patellar apprehension. NV intact distally.   MSK u/s Right knee - mild effusion noted as well as medial parameniscal cyst.  No bakers cyst.  No visible meniscus tear.  Assessment & Plan:  1. Bilateral knee pain - less symptomatic left knee likely due to mild degenerative changes.  Right knee confirmed parameniscal cyst with independently performed/reviewed ultrasound suggestive of medial meniscus tear.  Treat conservatively with physical therapy, tylenol, rx meloxicam.  Discussed topical medications, supplements.  Icing, compression.  F/u in 6 weeks.  Consider injection.

## 2016-11-22 NOTE — Addendum Note (Signed)
Addended by: Kathi SimpersWISE, Karelyn Brisby F on: 11/22/2016 01:58 PM   Modules accepted: Orders

## 2016-11-25 ENCOUNTER — Telehealth: Payer: Self-pay | Admitting: Physical Therapy

## 2016-11-25 NOTE — Telephone Encounter (Signed)
11/22/16 left message to pt to call to schedule PT eval, Spoke with pt on 11/25/16. Due to deductible and 0 met, pt will call MD office

## 2016-12-29 ENCOUNTER — Ambulatory Visit (INDEPENDENT_AMBULATORY_CARE_PROVIDER_SITE_OTHER): Payer: BC Managed Care – PPO | Admitting: Family Medicine

## 2016-12-29 ENCOUNTER — Encounter: Payer: Self-pay | Admitting: Family Medicine

## 2016-12-29 DIAGNOSIS — M25561 Pain in right knee: Secondary | ICD-10-CM

## 2016-12-29 DIAGNOSIS — M25562 Pain in left knee: Secondary | ICD-10-CM | POA: Diagnosis not present

## 2016-12-29 MED ORDER — METHYLPREDNISOLONE ACETATE 40 MG/ML IJ SUSP
40.0000 mg | Freq: Once | INTRAMUSCULAR | Status: AC
  Administered 2016-12-29: 40 mg via INTRA_ARTICULAR

## 2016-12-29 NOTE — Patient Instructions (Signed)
You have pes bursitis of your knees. Ice areas 15 minutes at a time 3-4 times a day. Straight leg raises, straight leg raises with foot turned outwards, hip side raises, hamstring curls, and hacky sack exercises 3 sets of 10 once a day. Add ankle weight if these become too easy. Continue the meloxicam as you have been. Consider physical therapy. Activities as tolerated though I would avoid squats, lunges, leg press if possible. Follow up with me in 6 weeks.

## 2017-01-04 NOTE — Assessment & Plan Note (Signed)
has underlying degenerative changes but current pain more consistent with pes bursitis.  Icing, tylenol and/or meloxicam.  Shown home exercises to do daily.  Consider physical therapy.  F/u in 6 weeks.  After informed written consent timeout was performed, patient was seated on exam table. Right knee overlying pes bursa was prepped with alcohol swab and then injected with 2:1 bupivicaine: depomedrol. Patient tolerated the procedure well without immediate complications.  After informed written consent timeout was performed, patient was seated on exam table. Left knee overlying pes bursa was prepped with alcohol swab and then injected with 2:1 bupivicaine: depomedrol. Patient tolerated the procedure well without immediate complications.

## 2017-01-04 NOTE — Progress Notes (Signed)
PCP: Esperanza RichtersSaguier, Edward, PA-C  Subjective:   HPI: Patient is a 51 y.o. female here for bilateral knee pain.  9/5: Patient states she's had several weeks of anterior bilateral knee pain. Pain worse after working out, doing yoga. Worse on right knee than left. Associated catching of right knee. Some swelling. Pain currently 0/10 but up to 5/10 on right, 2/10 on left, sharp Left knee is more anterior, right medial. No skin changes, numbness.  10/17: Patient reports her knees feel about the same. Pain level 6/10, sharp anteromedially now. Difficulty sleeping and getting up from seated position. Has been going to gym. No skin changes, numbness.  Past Medical History:  Diagnosis Date  . Allergy   . Anxiety   . Asthma     Current Outpatient Prescriptions on File Prior to Visit  Medication Sig Dispense Refill  . albuterol (PROVENTIL HFA;VENTOLIN HFA) 108 (90 Base) MCG/ACT inhaler Inhale 2 puffs into the lungs as needed.    . Fluticasone-Salmeterol (ADVAIR DISKUS) 250-50 MCG/DOSE AEPB Inhale 1 puff into the lungs as needed.    . meloxicam (MOBIC) 15 MG tablet Take 1 tablet (15 mg total) by mouth daily. 30 tablet 2  . Multiple Vitamins-Minerals (MULTIVITAMIN WITH MINERALS) tablet Take 1 tablet by mouth daily.    . nitroGLYCERIN (NITRODUR - DOSED IN MG/24 HR) 0.2 mg/hr patch Apply 1/4th patch to affected shoulder, change daily 30 patch 1   No current facility-administered medications on file prior to visit.     Past Surgical History:  Procedure Laterality Date  . CESAREAN SECTION      No Known Allergies  Social History   Social History  . Marital status: Single    Spouse name: N/A  . Number of children: N/A  . Years of education: N/A   Occupational History  . Not on file.   Social History Main Topics  . Smoking status: Never Smoker  . Smokeless tobacco: Never Used  . Alcohol use 0.0 oz/week     Comment: occasionally. 2 beers a month at most.  . Drug use: No  .  Sexual activity: Not on file   Other Topics Concern  . Not on file   Social History Narrative  . No narrative on file    Family History  Problem Relation Age of Onset  . Hypertension Mother   . Hypertension Father   . Heart disease Sister     BP 102/75   Pulse 77   Ht 5\' 4"  (1.626 m)   Wt 153 lb (69.4 kg)   BMI 26.26 kg/m   Review of Systems: See HPI above.     Objective:  Physical Exam:  Gen: NAD, comfortable in exam room.  Right knee: No gross deformity, ecchymoses, swelling. TTP pes bursa.  No other tenderness. FROM. Negative ant/post drawers. Negative valgus/varus testing. Negative lachmanns. Negative mcmurrays, apleys, patellar apprehension. NV intact distally.  Left knee: No gross deformity, ecchymoses, swelling. TTP pes bursa.  No other tenderness. FROM. Negative ant/post drawers. Negative valgus/varus testing. Negative lachmanns. Negative mcmurrays, apleys, patellar apprehension. NV intact distally.  Assessment & Plan:  1. Bilateral knee pain - has underlying degenerative changes but current pain more consistent with pes bursitis.  Icing, tylenol and/or meloxicam.  Shown home exercises to do daily.  Consider physical therapy.  F/u in 6 weeks.  After informed written consent timeout was performed, patient was seated on exam table. Right knee overlying pes bursa was prepped with alcohol swab and then injected with 2:1 bupivicaine:  depomedrol. Patient tolerated the procedure well without immediate complications.  After informed written consent timeout was performed, patient was seated on exam table. Left knee overlying pes bursa was prepped with alcohol swab and then injected with 2:1 bupivicaine: depomedrol. Patient tolerated the procedure well without immediate complications.

## 2017-02-09 ENCOUNTER — Encounter: Payer: Self-pay | Admitting: Family Medicine

## 2017-02-09 ENCOUNTER — Ambulatory Visit: Payer: BC Managed Care – PPO | Admitting: Family Medicine

## 2017-02-09 DIAGNOSIS — M25561 Pain in right knee: Secondary | ICD-10-CM

## 2017-02-09 DIAGNOSIS — M25562 Pain in left knee: Secondary | ICD-10-CM

## 2017-02-09 NOTE — Progress Notes (Signed)
PCP: Saguier, EdwarEsperanza Richtersd, PA-C  Subjective:   HPI: Patient is a 51 y.o. female here for bilateral knee pain.  9/5: Patient states she's had several weeks of anterior bilateral knee pain. Pain worse after working out, doing yoga. Worse on right knee than left. Associated catching of right knee. Some swelling. Pain currently 0/10 but up to 5/10 on right, 2/10 on left, sharp Left knee is more anterior, right medial. No skin changes, numbness.  10/17: Patient reports her knees feel about the same. Pain level 6/10, sharp anteromedially now. Difficulty sleeping and getting up from seated position. Has been going to gym. No skin changes, numbness.  11/28: Patient reports pes bursa injections only helped transiently. She is limping due to right worse than left knee pain. Pain 8/10 on right, 2/10 on left, sharp. Pain medial. Worse with walking. Doing home exercises. No skin changes, numbness.  Past Medical History:  Diagnosis Date  . Allergy   . Anxiety   . Asthma     Current Outpatient Medications on File Prior to Visit  Medication Sig Dispense Refill  . albuterol (PROVENTIL HFA;VENTOLIN HFA) 108 (90 Base) MCG/ACT inhaler Inhale 2 puffs into the lungs as needed.    . Fluticasone-Salmeterol (ADVAIR DISKUS) 250-50 MCG/DOSE AEPB Inhale 1 puff into the lungs as needed.    . meloxicam (MOBIC) 15 MG tablet Take 1 tablet (15 mg total) by mouth daily. 30 tablet 2  . Multiple Vitamins-Minerals (MULTIVITAMIN WITH MINERALS) tablet Take 1 tablet by mouth daily.    . nitroGLYCERIN (NITRODUR - DOSED IN MG/24 HR) 0.2 mg/hr patch Apply 1/4th patch to affected shoulder, change daily 30 patch 1   No current facility-administered medications on file prior to visit.     Past Surgical History:  Procedure Laterality Date  . CESAREAN SECTION      No Known Allergies  Social History   Socioeconomic History  . Marital status: Single    Spouse name: Not on file  . Number of children: Not on  file  . Years of education: Not on file  . Highest education level: Not on file  Social Needs  . Financial resource strain: Not on file  . Food insecurity - worry: Not on file  . Food insecurity - inability: Not on file  . Transportation needs - medical: Not on file  . Transportation needs - non-medical: Not on file  Occupational History  . Not on file  Tobacco Use  . Smoking status: Never Smoker  . Smokeless tobacco: Never Used  Substance and Sexual Activity  . Alcohol use: Yes    Alcohol/week: 0.0 oz    Comment: occasionally. 2 beers a month at most.  . Drug use: No  . Sexual activity: Not on file  Other Topics Concern  . Not on file  Social History Narrative  . Not on file    Family History  Problem Relation Age of Onset  . Hypertension Mother   . Hypertension Father   . Heart disease Sister     BP 99/68   Pulse 78   Ht 5\' 4"  (1.626 m)   Wt 153 lb (69.4 kg)   BMI 26.26 kg/m   Review of Systems: See HPI above.     Objective:  Physical Exam:  Gen: NAD, comfortable in exam room.  Right knee: No gross deformity, ecchymoses, swelling. TTP medial joint line > pes bursa. FROM with 5/5 strength. Negative ant/post drawers. Negative valgus/varus testing. Negative lachmanns. Negative mcmurrays, apleys, patellar apprehension. NV  intact distally.  Left knee: No gross deformity, ecchymoses, swelling. TTP medial joint line > pes bursa. FROM with 5/5 strength. Negative ant/post drawers. Negative valgus/varus testing. Negative lachmanns. Negative mcmurrays, apleys, patellar apprehension. NV intact distally.  Assessment & Plan:  1. Bilateral knee pain - unfortunately not improving with conservative treatment to date including home exercises, meloxicam, pes injections.  Discussed options - she would like to go ahead with MRI of more symptomatic right knee - will do so to assess for medial meniscus tear.  Consider physical therapy, intraarticular injections, ortho  referral depending on results.

## 2017-02-09 NOTE — Patient Instructions (Addendum)
We will go ahead with an MRI of your more symptomatic right knee. I will call you with the results and next steps.

## 2017-02-10 ENCOUNTER — Encounter: Payer: Self-pay | Admitting: Family Medicine

## 2017-02-10 NOTE — Assessment & Plan Note (Signed)
unfortunately not improving with conservative treatment to date including home exercises, meloxicam, pes injections.  Discussed options - she would like to go ahead with MRI of more symptomatic right knee - will do so to assess for medial meniscus tear.  Consider physical therapy, intraarticular injections, ortho referral depending on results.

## 2017-02-11 NOTE — Addendum Note (Signed)
Addended by: Kathi SimpersWISE, Jaleeyah Munce F on: 02/11/2017 08:27 AM   Modules accepted: Orders

## 2017-02-14 IMAGING — DX DG CERVICAL SPINE COMPLETE 4+V
6 series · 6 of 6 positions shown · non-contrast
Comparison: None.

CLINICAL DATA: Neck strain

EXAM:
CERVICAL SPINE - COMPLETE 4+ VIEW

[c-spine lat]
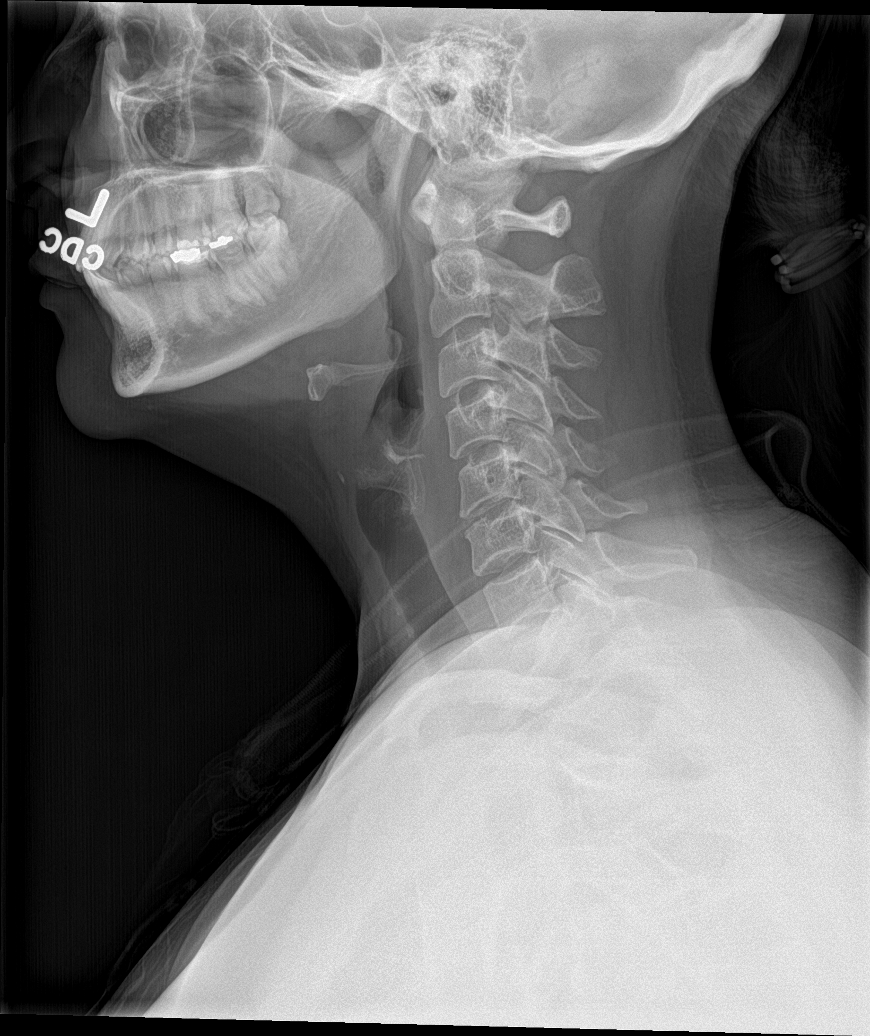

[c-spine obl (1 of 2)]
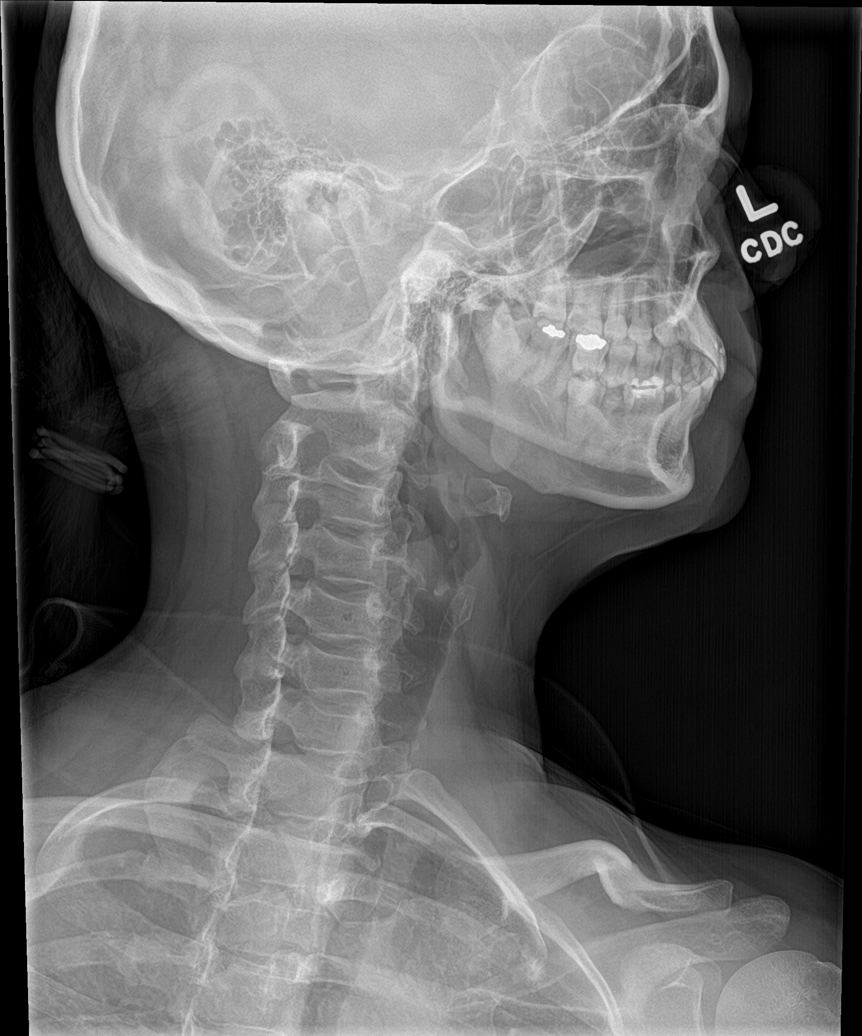

[c-spine obl (2 of 2)]
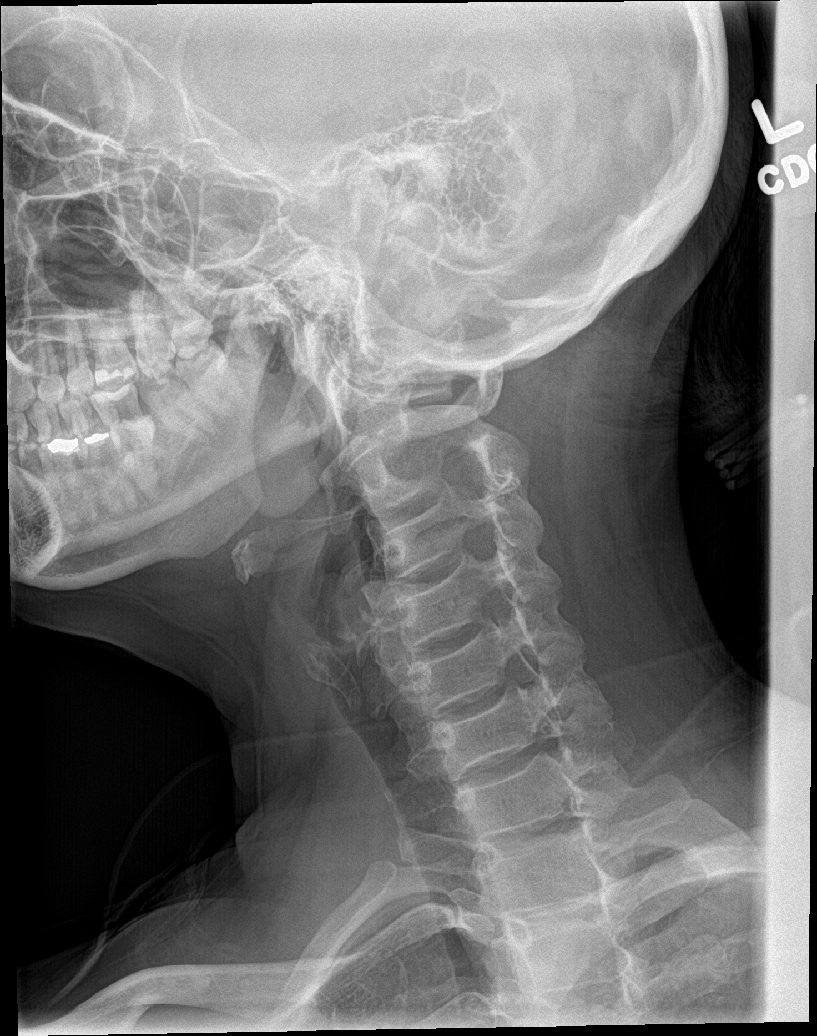

[c-spine ap]
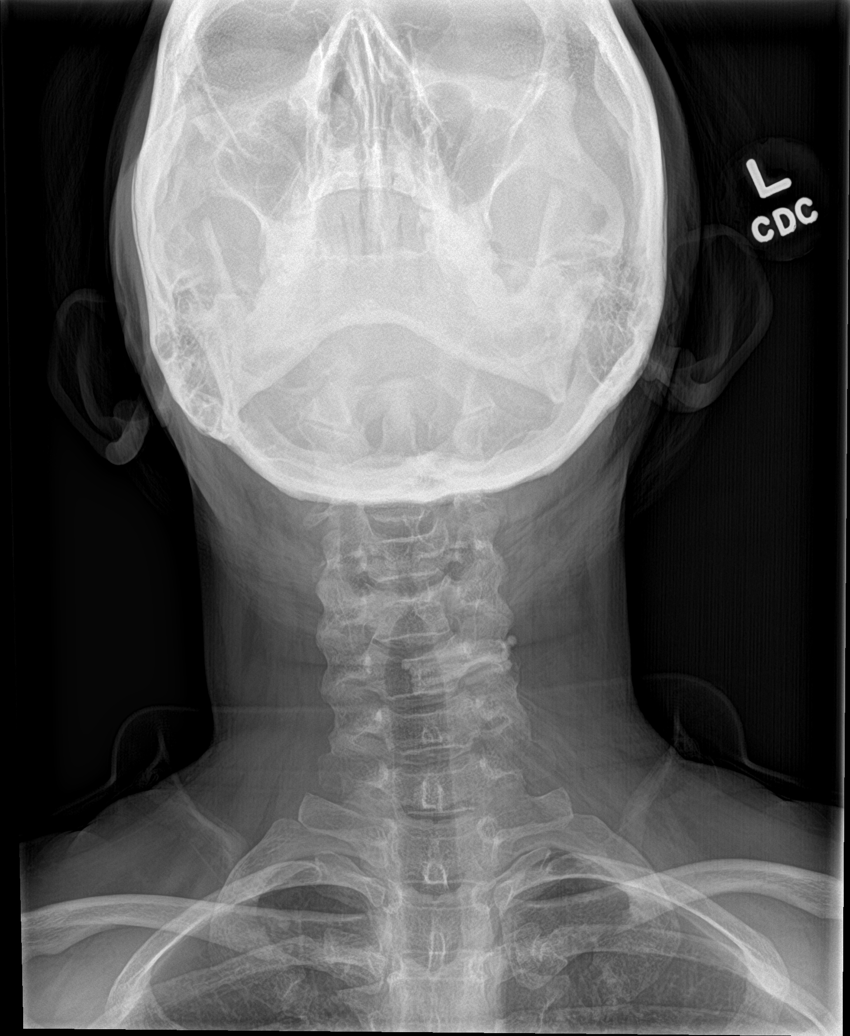

[c-spine open mouth]
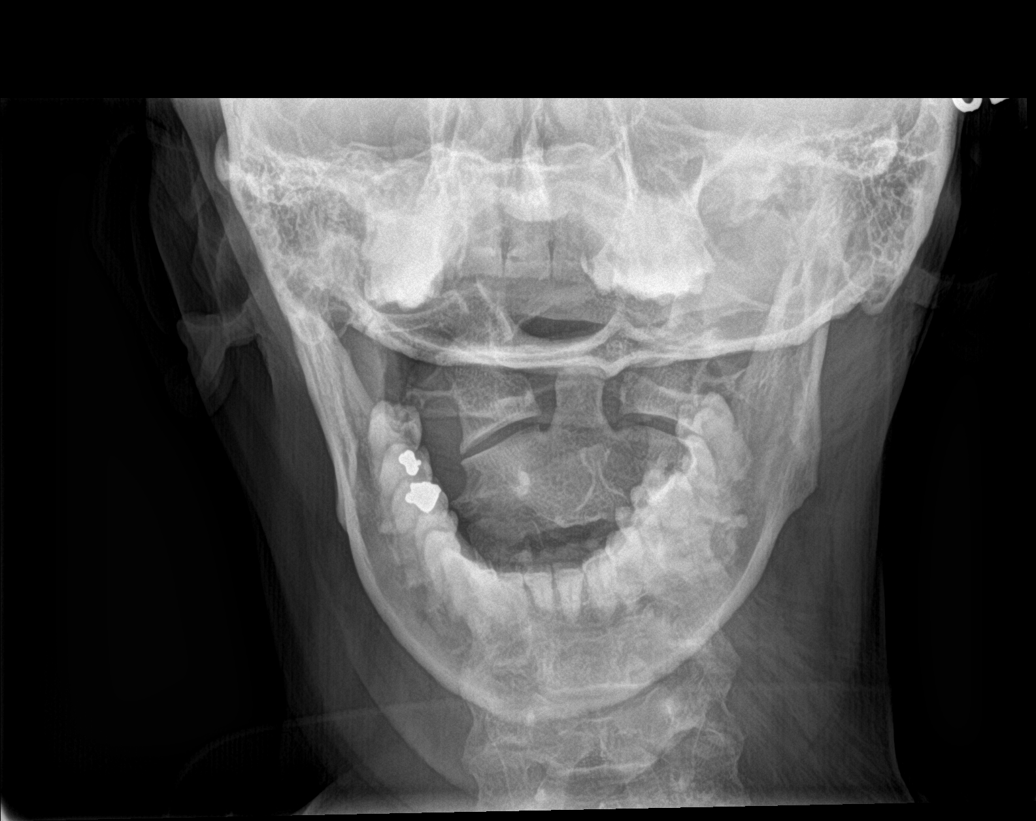

[c-spine swimmers]
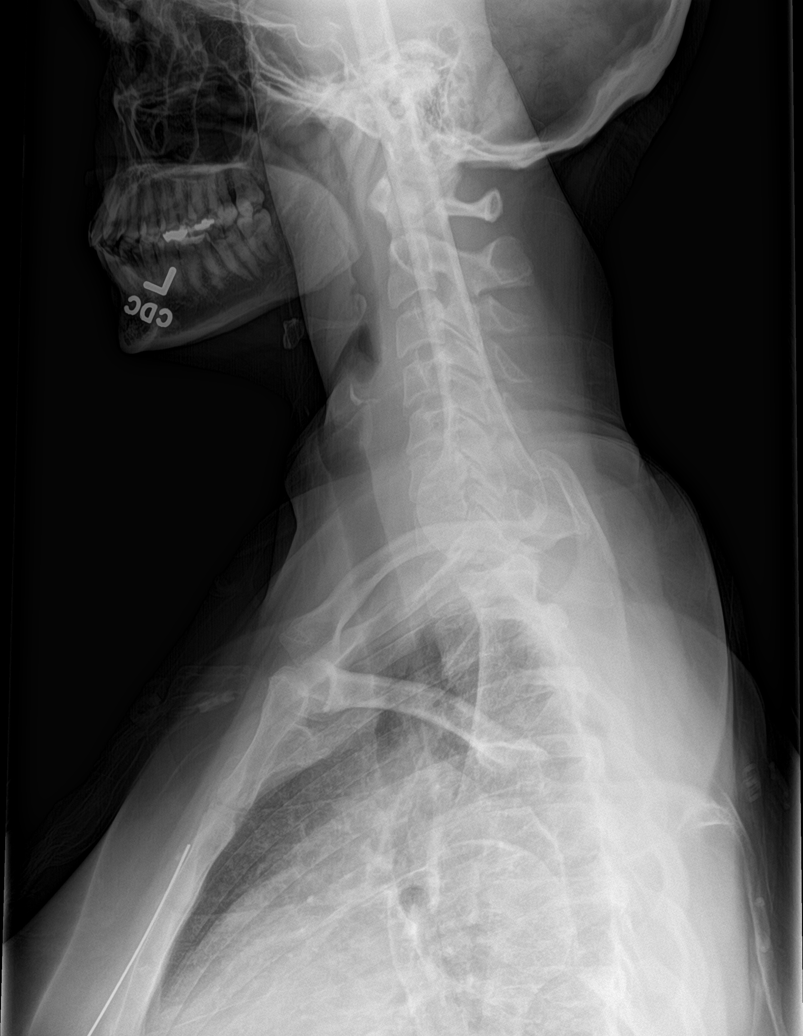

[6 of 6 positions shown; findings below may reference images not displayed]

FINDINGS: There is no evidence of cervical spine fracture or prevertebral soft
tissue swelling. Alignment is normal. No other significant bone
abnormalities are identified.
IMPRESSION: Negative cervical spine radiographs.

## 2018-06-20 ENCOUNTER — Telehealth: Payer: Self-pay | Admitting: Medical

## 2018-06-20 NOTE — Telephone Encounter (Signed)
Reviewing pt charts. She has not been seen in about 3 years this coming fall. Looks like she went to [facility] recently in 2020? Offer her virtual visit if needs anything but also could you figure out who she considers me her pcp. I am listed as her pcp presently. If she switch to [facility] let me know.

## 2018-06-21 NOTE — Telephone Encounter (Signed)
Pt switched to [facility]. PCP name removed from chart.

## 2019-05-07 ENCOUNTER — Other Ambulatory Visit: Payer: Self-pay

## 2019-05-07 ENCOUNTER — Ambulatory Visit: Payer: BC Managed Care – PPO | Admitting: Family Medicine

## 2019-05-07 DIAGNOSIS — M7711 Lateral epicondylitis, right elbow: Secondary | ICD-10-CM

## 2019-05-07 DIAGNOSIS — M79671 Pain in right foot: Secondary | ICD-10-CM | POA: Diagnosis not present

## 2019-05-07 DIAGNOSIS — M79672 Pain in left foot: Secondary | ICD-10-CM

## 2019-05-07 NOTE — Assessment & Plan Note (Signed)
Pain located on medial distal portion of first metatarsal.  Unclear etiology.  Exam not consistent with sesamoiditis.  Patient with good range of motion which makes first MTP OA less likely.  Exam findings not consistent with plantar fasciitis. -Patient to try insoles for your shoes to help with the medial great toe pain (spencos, superfeet, our green sports insoles). -To return if pain becomes more constant or she notices any swelling in the area.  Also may return if inserts not helping.  May need to add additional padding or do further imaging to determine etiology of patient's pain.

## 2019-05-07 NOTE — Patient Instructions (Signed)
You have lateral epicondylitis Try to avoid painful activities as much as possible. Ice the area 3-4 times a day for 15 minutes at a time. Tylenol or aleve as needed for pain. Counterforce brace as directed can help unload area - wear this regularly if it provides you with relief. Hammer rotation exercise, wrist extension exercise with 1 pound weight - 3 sets of 10 once a day.   Stretching - hold for 20-30 seconds and repeat 3 times. Consider physical therapy, injection, nitro patches if not improving. Follow up in 6 weeks.  Try insoles for your shoes to help with the medial great toe pain (spencos, superfeet, our green sports insoles). If this becomes more constant or you notice swelling or the inserts don't help enough let us know and we can add some additional padding (1st ray post) to help with this.

## 2019-05-07 NOTE — Assessment & Plan Note (Signed)
Patient with lateral epicondylitis based on hx and exam.  -Counseled on avoiding painful activities, ice therapy, Tylenol or NSAIDs prn -May usecounterforce brace as directed can help unload area  -given handout and shown exercises and stretches to help with pain -May consider physical therapy, injection, nitro patches if not improving. -Follow up in 6 weeks.

## 2019-05-07 NOTE — Progress Notes (Signed)
HPI  CC: R arm pain  Right arm pain 54 year old female here for 3 weeks of right forearm pain.  She believes that her left arm has started to hurt due to overcompensating and trying to avoid using her right arm.  She states that she does not remember having any falls or trauma to the area.  Simply woke up 1 day and noticed it.  States that the pain radiates from her right wrist to elbow.  States that brushing her teeth and brushing her hair exacerbates the pain.  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  She denies any numbness, tingling, weakness.  She does state that she has trouble letting it hang while she walks as it exacerbates her pain.  Bilateral foot pain Patient reports that for a little over 3 weeks she has noticed that she is having pain on the medial aspect of the distal portion of her great toe.  She states that it hurts most when she puts the toes on the ground during her walking gait.  She denies any numbness or tingling.  She denies any trauma to the area.  She reports she has never had this before.  She does not notice it hurting at any point in the day more than another time.  She reports that she does not always have the pain and is intermittent.  She has tried different shoes which have not relieved the pain.  See HPI and/or previous note for associated ROS.  PMH, meds, allergies, FH, SH reviewed and updated.  Objective: BP 110/72   Ht 5\' 4"  (1.626 m)   BMI 26.26 kg/m  Gen: NAD, well groomed, a/o x3, normal affect.  CV: Well-perfused. Warm.  Resp: Non-labored.  Neuro: Sensation intact throughout. No gross coordination deficits.  Gait:unremarkable stride without signs of limp or balance issues.  Elbow, R: TTP over lateral epicondyle Inspection yields no evidence of bony deformity, effusion, erythema, ecchymosis, or rash. Active and passive ROM intact in flexion/extension/supination/pronation. Strength 5/5 throughout. No TTP at  the medial epicondyle. Pain with finger/wrist extension against resistance. No pain with gripping or finger/wrist flexion against resistance.   Elbow, L: Inspection yields no evidence of bony deformity, effusion, erythema, ecchymosis, or rash. Active and passive ROM intact in flexion/extension/supination/pronation. Strength 5/5 throughout. No TTP at the medial or lateral epicondyle. No pain with finger/wrist extension against resistance. No pain with gripping or finger/wrist flexion against resistance.   Ankle/Foot, BL: No visible erythema, swelling, ecchymosis, or bony deformity. No notable pes planus/cavus deformity. Transverse arch grossly intact; Range of motion is full in all directions. Strength is 5/5 in all directions. No tenderness at the insertion/body/myotendinous junction of the Achilles tendon; No peroneal tendon tenderness or subluxation; No tenderness on posterior aspects of lateral and medial malleolus; Stable lateral and medial ligaments; Unremarkable calcaneal squeeze; No plantar calcaneal tenderness; No tenderness over the navicular prominence; No tenderness over cuboid; No pain at base of 5th MT.  No 1st MTP, sesamoid tenderness.  No hallux rigidus.  Assessment and plan:  Lateral epicondylitis of right elbow Patient with lateral epicondylitis based on hx and exam.  -Counseled on avoiding painful activities, ice therapy, Tylenol or NSAIDs prn -May use counterforce brace as directed can help unload area  -given handout and shown exercises and stretches to help with pain -May consider physical therapy, injection, nitro patches if not improving. -Follow up in 6 weeks.  Bilateral foot pain Pain located on  medial distal portion of first metatarsal.  Unclear etiology.  Exam not consistent with sesamoiditis.  Patient with good range of motion which makes first MTP OA less likely.  Exam findings not consistent with plantar fasciitis. -Patient to try insoles for your shoes to help with  the medial great toe pain (spencos, superfeet, our green sports insoles). -To return if pain becomes more constant or she notices any swelling in the area.  Also may return if inserts not helping.  May need to add additional padding or do further imaging to determine etiology of patient's pain.   Brooke Meshach Perry, DO PGY-3, [facility]  05/07/2019 3:50 PM

## 2019-05-08 ENCOUNTER — Encounter: Payer: Self-pay | Admitting: Family Medicine

## 2019-06-18 ENCOUNTER — Ambulatory Visit: Payer: BC Managed Care – PPO | Admitting: Family Medicine

## 2022-07-22 LAB — COLOGUARD: Cologuard: NEGATIVE

## 2023-05-05 ENCOUNTER — Encounter: Payer: Self-pay | Admitting: Family Medicine

## 2023-05-05 ENCOUNTER — Ambulatory Visit: Payer: 59 | Admitting: Family Medicine

## 2023-05-05 ENCOUNTER — Other Ambulatory Visit (HOSPITAL_COMMUNITY): Payer: Self-pay

## 2023-05-05 ENCOUNTER — Telehealth: Payer: Self-pay | Admitting: Pharmacy Technician

## 2023-05-05 VITALS — BP 112/60 | HR 67 | Temp 98.2°F | Ht 64.0 in | Wt 186.8 lb

## 2023-05-05 DIAGNOSIS — Z6832 Body mass index (BMI) 32.0-32.9, adult: Secondary | ICD-10-CM | POA: Diagnosis not present

## 2023-05-05 DIAGNOSIS — E66811 Obesity, class 1: Secondary | ICD-10-CM | POA: Insufficient documentation

## 2023-05-05 MED ORDER — PHENTERMINE HCL 15 MG PO CAPS: 15.0000 mg | ORAL_CAPSULE | ORAL | 0 refills | Status: DC

## 2023-05-05 NOTE — Patient Instructions (Addendum)
 Total number of calories: 1500 per day  Total number carbohydrates: around 75 grams per day   Total protein per day: Over 100 grams of protein per day  Apps that help track calories and carbs: Loseit! My fitness pal, and Licensed conveyancer

## 2023-05-05 NOTE — Telephone Encounter (Signed)
 Pharmacy Patient Advocate Encounter  Received notification from [facility] that Prior Authorization for Phentermine HCl 15MG  capsules has been APPROVED from 05/05/2023 to 08/03/2023. Ran test claim, Copay is $3.89. This test claim was processed through [facility]- copay amounts may vary at other pharmacies due to pharmacy/plan contracts, or as the patient moves through the different stages of their insurance plan.  KeyLayne Benton PA #/Case ID/Reference #: [id number]

## 2023-05-05 NOTE — Progress Notes (Signed)
 New Patient Office Visit  Subjective   Patient ID: Brooke Mcmahon, female    DOB: 09/20/1965  Age: 58 y.o. MRN: [id number]  CC:  Chief Complaint  Patient presents with   Establish Care    HPI Brooke Mcmahon presents to establish care Patient was being seen previous by Almyra Brace at [facility]. She reports no new symptoms but she does have concerns about weight gain. States that the weight gain started with menopause, went through it about 1- 1 1/2 years ago. States that she has gained about 40 pounds and is concerned about this. States she was going to the gym and was working out, but it wasn't helping her with the weight gain.   I reviewed her labs from April 2024 that were in the Care everywhere section of the chart. She had a negative Cologuard 07/22/2022  I have reviewed the patient's medical history in detail and updated the computerized patient record.  Current Outpatient Medications  Medication Instructions   Multiple Vitamins-Minerals (MULTIVITAMIN WITH MINERALS) tablet 1 tablet, Daily   phentermine 15 mg, Oral, BH-each morning   [START ON 05/30/2023] phentermine 15 mg, Oral, BH-each morning    Past Medical History:  Diagnosis Date   Allergy    Anxiety    Asthma     Past Surgical History:  Procedure Laterality Date   CESAREAN SECTION      Family History  Problem Relation Age of Onset   Hypertension Mother    Hypertension Father    Heart disease Sister     Social History   Socioeconomic History   Marital status: Divorced    Spouse name: Not on file   Number of children: Not on file   Years of education: Not on file   Highest education level: Associate degree: academic program  Occupational History   Not on file  Tobacco Use   Smoking status: Never   Smokeless tobacco: Never  Vaping Use   Vaping status: Never Used  Substance and Sexual Activity   Alcohol use: Yes    Alcohol/week: 0.0 standard drinks of alcohol    Comment: occasionally. 2 beers  a month at most.   Drug use: No   Sexual activity: Yes  Other Topics Concern   Not on file  Social History Narrative   Not on file   Social Drivers of Health   Financial Resource Strain: Low Risk  (05/04/2023)   Overall Financial Resource Strain (CARDIA)    Difficulty of Paying Living Expenses: Not hard at all  Food Insecurity: No Food Insecurity (05/04/2023)   Hunger Vital Sign    Worried About Running Out of Food in the Last Year: Never true    Ran Out of Food in the Last Year: Never true  Transportation Needs: No Transportation Needs (05/04/2023)   PRAPARE - Administrator, Civil Service (Medical): No    Lack of Transportation (Non-Medical): No  Physical Activity: Sufficiently Active (05/04/2023)   Exercise Vital Sign    Days of Exercise per Week: 5 days    Minutes of Exercise per Session: 40 min  Stress: No Stress Concern Present (05/04/2023)   [employer] of Occupational Health - Occupational Stress Questionnaire    Feeling of Stress : Not at all  Social Connections: Socially Isolated (05/04/2023)   Social Connection and Isolation Panel [NHANES]    Frequency of Communication with Friends and Family: More than three times a week    Frequency of Social Gatherings with Friends  and Family: More than three times a week    Attends Religious Services: Never    Active Member of Clubs or Organizations: No    Attends Engineer, structural: Not on file    Marital Status: Divorced  Intimate Partner Violence: Not on file    Review of Systems  All other systems reviewed and are negative.       Objective   BP 112/60   Pulse 67   Temp 98.2 F (36.8 C) (Oral)   Ht 5\' 4"  (1.626 m)   Wt 186 lb 12.8 oz (84.7 kg)   LMP 05/04/2018 (Approximate)   SpO2 98%   BMI 32.06 kg/m   Physical Exam Vitals reviewed.  Constitutional:      Appearance: Normal appearance. She is well-groomed. She is obese.  Cardiovascular:     Rate and Rhythm: Normal rate and regular  rhythm.     Pulses: Normal pulses.     Heart sounds: S1 normal and S2 normal.  Pulmonary:     Effort: Pulmonary effort is normal.     Breath sounds: Normal breath sounds and air entry.  Musculoskeletal:     Right lower leg: No edema.     Left lower leg: No edema.  Neurological:     Mental Status: She is alert and oriented to person, place, and time. Mental status is at baseline.     Gait: Gait is intact.  Psychiatric:        Mood and Affect: Mood and affect normal.        Speech: Speech normal.        Behavior: Behavior normal.         Assessment & Plan:  Obesity (BMI 30.0-34.9) Assessment & Plan: I have had an extensive 30 minute conversation today with the patient about healthy eating habits, exercise, calorie and carb goals for sustainable and successful weight loss. I gave the patient caloric and protein daily intake values as well as described the importance of increasing fiber and water intake. I discussed weight loss medications that could be used in the treatment of this patient. Handouts on low carb eating were given to the patient.    Pt would be a good candidate for phentermine therapy, will start her on this medication. RTC in 3 months for her annual physical.   Total number of calories: 1500 per day  Total number carbohydrates: around 75 grams per day   Total protein per day: Over 100 grams of protein per day  Orders: -     Phentermine HCl; Take 1 capsule (15 mg total) by mouth every morning.  Dispense: 30 capsule; Refill: 0 -     Phentermine HCl; Take 1 capsule (15 mg total) by mouth every morning.  Dispense: 30 capsule; Refill: 0    Return in about 3 months (around 08/02/2023) for annual physical exam.   Karie Georges, MD

## 2023-05-05 NOTE — Assessment & Plan Note (Signed)
 I have had an extensive 30 minute conversation today with the patient about healthy eating habits, exercise, calorie and carb goals for sustainable and successful weight loss. I gave the patient caloric and protein daily intake values as well as described the importance of increasing fiber and water intake. I discussed weight loss medications that could be used in the treatment of this patient. Handouts on low carb eating were given to the patient.    Pt would be a good candidate for phentermine therapy, will start her on this medication. RTC in 3 months for her annual physical.   Total number of calories: 1500 per day  Total number carbohydrates: around 75 grams per day   Total protein per day: Over 100 grams of protein per day

## 2023-05-06 ENCOUNTER — Other Ambulatory Visit (HOSPITAL_COMMUNITY): Payer: Self-pay

## 2023-07-13 ENCOUNTER — Other Ambulatory Visit (HOSPITAL_COMMUNITY)
Admission: RE | Admit: 2023-07-13 | Discharge: 2023-07-13 | Disposition: A | Discharge status: Home / Self Care | Source: Ambulatory Visit | Attending: *Deleted | Admitting: *Deleted

## 2023-07-13 ENCOUNTER — Encounter: Payer: Self-pay | Admitting: Family Medicine

## 2023-07-13 ENCOUNTER — Ambulatory Visit (INDEPENDENT_AMBULATORY_CARE_PROVIDER_SITE_OTHER): Payer: 59 | Admitting: Family Medicine

## 2023-07-13 VITALS — BP 100/72 | HR 75 | Temp 98.5°F | Ht 64.75 in | Wt 171.4 lb

## 2023-07-13 DIAGNOSIS — E66811 Obesity, class 1: Secondary | ICD-10-CM

## 2023-07-13 DIAGNOSIS — Z0000 Encounter for general adult medical examination without abnormal findings: Secondary | ICD-10-CM | POA: Diagnosis not present

## 2023-07-13 DIAGNOSIS — Z1231 Encounter for screening mammogram for malignant neoplasm of breast: Secondary | ICD-10-CM

## 2023-07-13 DIAGNOSIS — Z124 Encounter for screening for malignant neoplasm of cervix: Secondary | ICD-10-CM | POA: Diagnosis present

## 2023-07-13 DIAGNOSIS — Z13220 Encounter for screening for lipoid disorders: Secondary | ICD-10-CM | POA: Diagnosis not present

## 2023-07-13 LAB — LIPID PANEL
Cholesterol: 194 mg/dL (ref 0–200)
HDL: 55.1 mg/dL (ref >39.00)
LDL Cholesterol: 127 mg/dL — ABNORMAL HIGH (ref 0–99)
NonHDL: 139.37
Total CHOL/HDL Ratio: 4
Triglycerides: 61 mg/dL (ref 0.0–149.0)
VLDL: 12.2 mg/dL (ref 0.0–40.0)

## 2023-07-13 LAB — COMPREHENSIVE METABOLIC PANEL WITH GFR
ALT: 16 U/L (ref 0–35)
AST: 16 U/L (ref 0–37)
Albumin: 4.2 g/dL (ref 3.5–5.2)
Alkaline Phosphatase: 69 U/L (ref 39–117)
BUN: 20 mg/dL (ref 6–23)
CO2: 29 meq/L (ref 19–32)
Calcium: 9.2 mg/dL (ref 8.4–10.5)
Chloride: 103 meq/L (ref 96–112)
Creatinine, Ser: 0.73 mg/dL (ref 0.40–1.20)
GFR: 91.88 mL/min (ref >60.00)
Glucose, Bld: 91 mg/dL (ref 70–99)
Potassium: 4.1 meq/L (ref 3.5–5.1)
Sodium: 140 meq/L (ref 135–145)
Total Bilirubin: 0.5 mg/dL (ref 0.2–1.2)
Total Protein: 6.7 g/dL (ref 6.0–8.3)

## 2023-07-13 MED ORDER — PHENTERMINE HCL 15 MG PO CAPS: 15.0000 mg | ORAL_CAPSULE | ORAL | 0 refills | Status: AC

## 2023-07-13 NOTE — Progress Notes (Signed)
 Complete physical exam  Patient: Brooke Mcmahon   DOB: [birth year]   58 y.o. Female  MRN: [id number]  Subjective:    Chief Complaint  Patient presents with   Annual Exam    Acsa Mcmahon is a 58 y.o. female who presents today for a complete physical exam. She reports consuming a general diet. Patient exercises regularly, walks every day for at least 30 minutes daily.  Is also lifting weights three times per week. She generally feels well. She reports sleeping has difficulty falling asleep, happens often. She does not have additional problems to discuss today.    Most recent fall risk assessment:    09/08/2015   11:47 AM  Fall Risk   Falls in the past year? No     Most recent depression screenings:    07/13/2023    8:13 AM 05/05/2023    1:03 PM  PHQ 2/9 Scores  PHQ - 2 Score 0 0  PHQ- 9 Score 1     Vision:Within last year and Dental: No current dental problems and Receives regular dental care  Patient Active Problem List   Diagnosis Date Noted   Obesity (BMI 30.0-34.9) 05/05/2023   Lateral epicondylitis of right elbow 05/07/2019   Bilateral foot pain 05/07/2019   Bilateral knee pain 11/21/2016   Right arm pain 09/29/2015   Allergic rhinitis 04/30/2015      Patient Care Team: Aida House, MD as PCP - General (Family Medicine)   Outpatient Medications Prior to Visit  Medication Sig   Multiple Vitamins-Minerals (MULTIVITAMIN WITH MINERALS) tablet Take 1 tablet by mouth daily.   [DISCONTINUED] phentermine  15 MG capsule Take 1 capsule (15 mg total) by mouth every morning.   [DISCONTINUED] phentermine  15 MG capsule Take 1 capsule (15 mg total) by mouth every morning.   No facility-administered medications prior to visit.    Review of Systems  HENT:  Negative for hearing loss.   Eyes:  Negative for blurred vision.  Respiratory:  Negative for shortness of breath.   Cardiovascular:  Negative for chest pain.  Gastrointestinal: Negative.   Genitourinary:  Negative.   Musculoskeletal:  Negative for back pain.  Neurological:  Negative for headaches.  Psychiatric/Behavioral:  Negative for depression.        Objective:     BP 100/72   Pulse 75   Temp 98.5 F (36.9 C) (Oral)   Ht 5' 4.75" (1.645 m)   Wt 171 lb 6.4 oz (77.7 kg)   LMP 05/04/2018 (Approximate)   SpO2 98%   BMI 28.74 kg/m    Physical Exam Vitals reviewed. Exam conducted with a chaperone present.  Constitutional:      Appearance: Normal appearance. She is well-groomed and normal weight.  HENT:     Right Ear: Tympanic membrane and ear canal normal.     Left Ear: Tympanic membrane and ear canal normal.     Mouth/Throat:     Mouth: Mucous membranes are moist.     Pharynx: No posterior oropharyngeal erythema.  Neck:     Thyroid : No thyromegaly.  Cardiovascular:     Rate and Rhythm: Normal rate and regular rhythm.     Pulses: Normal pulses.     Heart sounds: Normal heart sounds, S1 normal and S2 normal.  Pulmonary:     Effort: Pulmonary effort is normal.     Breath sounds: Normal breath sounds and air entry.  Abdominal:     General: Abdomen is flat. Bowel sounds are normal.  Palpations: Abdomen is soft.  Genitourinary:    General: Normal vulva.     Exam position: Lithotomy position.     Tanner stage (genital): 5.     Vagina: Normal.     Cervix: Normal.     Uterus: Normal.      Adnexa: Right adnexa normal and left adnexa normal.     Rectum: Normal.  Musculoskeletal:     Right lower leg: No edema.     Left lower leg: No edema.  Lymphadenopathy:     Cervical: No cervical adenopathy.  Neurological:     General: No focal deficit present.     Mental Status: She is alert and oriented to person, place, and time. Mental status is at baseline.     Gait: Gait is intact.  Psychiatric:        Mood and Affect: Mood and affect normal.        Speech: Speech normal.        Behavior: Behavior normal.        Judgment: Judgment normal.      Results for orders  placed or performed in visit on 07/13/23  Cologuard  Result Value Ref Range   Cologuard Negative Negative       Assessment & Plan:    Routine Health Maintenance and Physical Exam  Immunization History  Administered Date(s) Administered   Influenza-Unspecified 01/14/2015, 12/28/2015   Moderna Sars-Covid-2 Vaccination 11/15/2019   Td 03/16/2011    Health Maintenance  Topic Date Due   Pneumococcal Vaccine 4-23 Years old (1 of 2 - PCV) Never done   MAMMOGRAM  03/15/2013   COVID-19 Vaccine (3 - Mixed Product risk series) 12/13/2019   DTaP/Tdap/Td (2 - Tdap) 03/15/2021   Cervical Cancer Screening (HPV/Pap Cotest)  11/12/2022   Zoster Vaccines- Shingrix (1 of 2) 08/02/2023 (Originally 01/11/1986)   Hepatitis C Screening  05/04/2024 (Originally 01/11/1985)   HIV Screening  05/04/2024 (Originally 01/11/1982)   INFLUENZA VACCINE  10/14/2023   Fecal DNA (Cologuard)  07/21/2025   HPV VACCINES  Aged Out   Meningococcal B Vaccine  Aged Out    Discussed health benefits of physical activity, and encouraged her to engage in regular exercise appropriate for her age and condition.  Lipid screening -     Lipid panel; Future  Obesity (BMI 30.0-34.9) -     Phentermine  HCl; Take 1 capsule (15 mg total) by mouth every morning.  Dispense: 30 capsule; Refill: 0 -     Phentermine  HCl; Take 1 capsule (15 mg total) by mouth every morning.  Dispense: 30 capsule; Refill: 0  Routine general medical examination at a health care facility Normal physical exam findings today. I counseled the patient on healthy sleep habits, handouts were given on this topic. Pt is doing well with diet and exercise, handouts given on this as well. Reviewed HM and updated the chart, pap performed today and labs ordered for annual surveillance.   -     Comprehensive metabolic panel with GFR; Future  Cervical cancer screening -     Cytology - PAP  Breast cancer screening by mammogram -     3D Screening Mammogram, Left  and Right; Future    Return in about 1 year (around 07/12/2024) for annual physical exam.     Aida House, MD

## 2023-07-13 NOTE — Patient Instructions (Signed)

## 2023-07-14 LAB — CYTOLOGY - PAP
Adequacy: ABSENT
Comment: NEGATIVE
Diagnosis: NEGATIVE
High risk HPV: NEGATIVE

## 2023-07-18 ENCOUNTER — Encounter: Payer: Self-pay | Admitting: Family Medicine

## 2023-08-17 ENCOUNTER — Other Ambulatory Visit (HOSPITAL_BASED_OUTPATIENT_CLINIC_OR_DEPARTMENT_OTHER): Payer: Self-pay

## 2023-08-24 ENCOUNTER — Other Ambulatory Visit (HOSPITAL_COMMUNITY): Payer: Self-pay

## 2023-08-24 ENCOUNTER — Telehealth: Payer: Self-pay

## 2023-08-24 NOTE — Telephone Encounter (Signed)
 Pharmacy Patient Advocate Encounter   Received notification from Patient Pharmacy that prior authorization for Phentermine  15 caps is required/requested.   Insurance verification completed.   The patient is insured through [facility] .   Per test claim: PA required; PA submitted to above mentioned insurance via CoverMyMeds Key/confirmation #/[id number] Status is pending

## 2023-08-24 NOTE — Telephone Encounter (Signed)
 Pharmacy Patient Advocate Encounter  Received notification from [facility] that Prior Authorization for Phentermine  15 has been DENIED.  Full denial letter will be uploaded to the media tab. See denial reason below.   PA #/Case ID/Reference #: [id number]

## 2023-08-26 NOTE — Telephone Encounter (Signed)
 Pt may purchase these without using her insurance -- it should only be about 15 dollars per month

## 2023-08-26 NOTE — Telephone Encounter (Signed)
 Patient informed of the message below.

## 2023-12-23 ENCOUNTER — Encounter: Payer: Self-pay | Admitting: Family Medicine

## 2023-12-23 ENCOUNTER — Other Ambulatory Visit: Payer: Self-pay | Admitting: Family Medicine

## 2023-12-23 DIAGNOSIS — E66811 Obesity, class 1: Secondary | ICD-10-CM

## 2023-12-23 NOTE — Telephone Encounter (Signed)
 Copied from CRM [id number]. Topic: Clinical - Medication Refill >> Dec 23, 2023  2:00 PM Alfonso ORN wrote: Medication:  phentermine  15 MG capsule  Has the patient contacted their pharmacy? No   This is the patient's preferred pharmacy:  [id number] [place], [place] - [address] [place] [place] [address] Phone: [phone] Fax: [phone]  Is this the correct pharmacy for this prescription? Yes   Has the prescription been filled recently? No  Is the patient out of the medication? No  Has the patient been seen for an appointment in the last year OR does the patient have an upcoming appointment? Yes  Can we respond through MyChart? Yes  Agent: Please be advised that Rx refills may take up to 3 business days. We ask that you follow-up with your pharmacy.
